# Patient Record
Sex: Female | Born: 1960 | Race: White | Hispanic: No | Smoking: Never smoker
Health system: Southern US, Community
[De-identification: ages and names within clinical notes are randomized; demographics above are authoritative.]

## PROBLEM LIST (undated history)

## (undated) DIAGNOSIS — H9190 Unspecified hearing loss, unspecified ear: Secondary | ICD-10-CM

## (undated) DIAGNOSIS — M549 Dorsalgia, unspecified: Secondary | ICD-10-CM

## (undated) DIAGNOSIS — F1010 Alcohol abuse, uncomplicated: Secondary | ICD-10-CM

## (undated) DIAGNOSIS — M1990 Unspecified osteoarthritis, unspecified site: Secondary | ICD-10-CM

## (undated) HISTORY — PX: TUBAL LIGATION: SHX77

## (undated) HISTORY — PX: FRACTURE SURGERY: SHX138

## (undated) HISTORY — PX: APPENDECTOMY: SHX54

## (undated) HISTORY — PX: BACK SURGERY: SHX140

---
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
## 1991-06-04 HISTORY — PX: APPENDECTOMY: SHX54

## 2007-08-04 ENCOUNTER — Emergency Department (HOSPITAL_COMMUNITY): Admission: EM | Admit: 2007-08-04 | Discharge: 2007-08-04 | Payer: Self-pay | Admitting: Emergency Medicine

## 2009-05-07 ENCOUNTER — Emergency Department (HOSPITAL_COMMUNITY): Admission: EM | Admit: 2009-05-07 | Discharge: 2009-05-07 | Payer: Self-pay | Admitting: Emergency Medicine

## 2009-05-11 ENCOUNTER — Emergency Department (HOSPITAL_COMMUNITY): Admission: EM | Admit: 2009-05-11 | Discharge: 2009-05-11 | Payer: Self-pay | Admitting: Emergency Medicine

## 2009-05-13 ENCOUNTER — Emergency Department (HOSPITAL_COMMUNITY): Admission: EM | Admit: 2009-05-13 | Discharge: 2009-05-13 | Payer: Self-pay | Admitting: Emergency Medicine

## 2009-10-22 ENCOUNTER — Emergency Department (HOSPITAL_COMMUNITY): Admission: EM | Admit: 2009-10-22 | Discharge: 2009-10-22 | Payer: Self-pay | Admitting: Emergency Medicine

## 2010-04-24 ENCOUNTER — Emergency Department (HOSPITAL_COMMUNITY): Admission: EM | Admit: 2010-04-24 | Discharge: 2010-04-24 | Payer: Self-pay | Admitting: Emergency Medicine

## 2010-10-21 ENCOUNTER — Emergency Department (HOSPITAL_COMMUNITY)
Admission: EM | Admit: 2010-10-21 | Discharge: 2010-10-22 | Disposition: A | Discharge status: Home / Self Care | Payer: Self-pay | Attending: Emergency Medicine | Admitting: Emergency Medicine

## 2010-10-21 DIAGNOSIS — N390 Urinary tract infection, site not specified: Secondary | ICD-10-CM | POA: Insufficient documentation

## 2010-10-21 DIAGNOSIS — R109 Unspecified abdominal pain: Secondary | ICD-10-CM | POA: Insufficient documentation

## 2010-10-22 LAB — URINE MICROSCOPIC-ADD ON

## 2010-10-22 LAB — COMPREHENSIVE METABOLIC PANEL
ALT: 40 U/L — ABNORMAL HIGH (ref 0–35)
AST: 32 U/L (ref 0–37)
Albumin: 2.9 g/dL — ABNORMAL LOW (ref 3.5–5.2)
Alkaline Phosphatase: 46 U/L (ref 39–117)
CO2: 27 mEq/L (ref 19–32)
Chloride: 100 mEq/L (ref 96–112)
GFR calc Af Amer: >60 mL/min (ref >60)
GFR calc non Af Amer: >60 mL/min (ref >60)
Potassium: 3.6 mEq/L (ref 3.5–5.1)
Sodium: 133 mEq/L — ABNORMAL LOW (ref 135–145)
Total Bilirubin: 0.2 mg/dL — ABNORMAL LOW (ref 0.3–1.2)

## 2010-10-22 LAB — DIFFERENTIAL
Basophils Relative: 0 % (ref 0–1)
Eosinophils Absolute: 0.1 10*3/uL (ref 0.0–0.7)
Lymphs Abs: 1.2 10*3/uL (ref 0.7–4.0)
Monocytes Relative: 8 % (ref 3–12)
Neutro Abs: 5.2 10*3/uL (ref 1.7–7.7)
Neutrophils Relative %: 74 % (ref 43–77)

## 2010-10-22 LAB — URINALYSIS, ROUTINE W REFLEX MICROSCOPIC
Ketones, ur: NEGATIVE mg/dL
Specific Gravity, Urine: 1.02 (ref 1.005–1.030)
Urobilinogen, UA: 0.2 mg/dL (ref 0.0–1.0)
pH: 6 (ref 5.0–8.0)

## 2010-10-22 LAB — CBC
Hemoglobin: 12.5 g/dL (ref 12.0–15.0)
MCH: 34 pg (ref 26.0–34.0)
Platelets: 179 10*3/uL (ref 150–400)
RBC: 3.68 MIL/uL — ABNORMAL LOW (ref 3.87–5.11)
WBC: 7 10*3/uL (ref 4.0–10.5)

## 2010-10-24 LAB — URINE CULTURE: Colony Count: >=100000

## 2010-11-10 ENCOUNTER — Emergency Department (HOSPITAL_COMMUNITY)
Admission: EM | Admit: 2010-11-10 | Discharge: 2010-11-10 | Disposition: A | Discharge status: Home / Self Care | Payer: Self-pay | Attending: Emergency Medicine | Admitting: Emergency Medicine

## 2010-11-10 DIAGNOSIS — B029 Zoster without complications: Secondary | ICD-10-CM | POA: Insufficient documentation

## 2010-11-15 ENCOUNTER — Emergency Department (HOSPITAL_COMMUNITY)
Admission: EM | Admit: 2010-11-15 | Discharge: 2010-11-15 | Disposition: A | Discharge status: Home / Self Care | Payer: Self-pay | Attending: Emergency Medicine | Admitting: Emergency Medicine

## 2010-11-15 DIAGNOSIS — B029 Zoster without complications: Secondary | ICD-10-CM | POA: Insufficient documentation

## 2010-11-15 DIAGNOSIS — F1021 Alcohol dependence, in remission: Secondary | ICD-10-CM | POA: Insufficient documentation

## 2011-01-24 ENCOUNTER — Emergency Department (HOSPITAL_COMMUNITY)
Admission: EM | Admit: 2011-01-24 | Discharge: 2011-01-24 | Disposition: A | Discharge status: Home / Self Care | Payer: Self-pay | Attending: Emergency Medicine | Admitting: Emergency Medicine

## 2011-01-24 ENCOUNTER — Encounter: Payer: Self-pay | Admitting: Emergency Medicine

## 2011-01-24 DIAGNOSIS — L03119 Cellulitis of unspecified part of limb: Secondary | ICD-10-CM | POA: Insufficient documentation

## 2011-01-24 DIAGNOSIS — L02419 Cutaneous abscess of limb, unspecified: Secondary | ICD-10-CM | POA: Insufficient documentation

## 2011-01-24 DIAGNOSIS — L0291 Cutaneous abscess, unspecified: Secondary | ICD-10-CM

## 2011-01-24 MED ORDER — SULFAMETHOXAZOLE-TRIMETHOPRIM 800-160 MG PO TABS: 1.0000 | ORAL_TABLET | Freq: Two times a day (BID) | ORAL | Status: AC

## 2011-01-24 MED ORDER — HYDROCODONE-ACETAMINOPHEN 5-325 MG PO TABS: ORAL_TABLET | ORAL | Status: DC

## 2011-01-24 NOTE — ED Provider Notes (Signed)
Medical screening examination/treatment/procedure(s) were performed by non-physician practitioner and as supervising physician I was immediately available for consultation/collaboration.   Lyanne Co, MD 01/24/11 [phone]

## 2011-01-24 NOTE — ED Notes (Signed)
Pt c/o "spider bite" to r leg x 4 days.

## 2011-01-24 NOTE — ED Provider Notes (Signed)
History     CSN: [id number] Arrival date & time: 01/24/2011  4:00 PM  Chief Complaint  Patient presents with  . Abscess   Patient is a 50 y.o. female presenting with abscess. The history is provided by the patient.  Abscess  This is a new problem. The current episode started less than one week ago. The problem has been unchanged. The abscess is present on the right upper leg. The problem is moderate. The abscess is characterized by redness and painfulness. It is unknown what she was exposed to. Pertinent negatives include no anorexia, no fever, no vomiting and no cough. She has received no recent medical care.    History reviewed. No pertinent past medical history.  Past Surgical History  Procedure Date  . Tubal ligation   . Back surgery     No family history on file.  History  Substance Use Topics  . Smoking status: Never Smoker   . Smokeless tobacco: Not on file  . Alcohol Use: 3.6 oz/week    6 Cans of beer per week    OB History    Grav Para Term Preterm Abortions TAB SAB Ect Mult Living   3 2  2 1  1   2       Review of Systems  Constitutional: Negative for fever and activity change.       All ROS Neg except as noted in HPI  HENT: Negative for nosebleeds and neck pain.   Eyes: Negative for photophobia and discharge.  Respiratory: Negative for cough, shortness of breath and wheezing.   Cardiovascular: Negative for chest pain and palpitations.  Gastrointestinal: Negative for vomiting, abdominal pain, blood in stool and anorexia.  Genitourinary: Negative for dysuria, frequency and hematuria.  Musculoskeletal: Negative for back pain and arthralgias.  Skin: Negative.   Neurological: Negative for dizziness, seizures and speech difficulty.  Psychiatric/Behavioral: Negative for hallucinations and confusion.    Physical Exam  BP 130/90  Pulse 75  Temp(Src) 98.2 F (36.8 C) (Oral)  Resp 20  Ht 5\' 3"  (1.6 m)  Wt 145 lb (65.772 kg)  BMI 25.69 kg/m2  SpO2 100%  LMP  01/13/2011  Physical Exam  Nursing note and vitals reviewed. Constitutional: She is oriented to person, place, and time. She appears well-developed and well-nourished.  Non-toxic appearance.  HENT:  Head: Normocephalic.  Right Ear: Tympanic membrane and external ear normal.  Left Ear: Tympanic membrane and external ear normal.  Eyes: EOM and lids are normal. Pupils are equal, round, and reactive to light.  Neck: Normal range of motion. Neck supple. Carotid bruit is not present.  Cardiovascular: Normal rate, regular rhythm, normal heart sounds, intact distal pulses and normal pulses.   Pulmonary/Chest: Breath sounds normal. No respiratory distress.  Abdominal: Soft. Bowel sounds are normal. There is no tenderness. There is no guarding.  Musculoskeletal: Normal range of motion.       Abscess of the posterior right thigh. No red streaking. Not hot. No palpable nodes of the inguinal area.  Lymphadenopathy:       Head (right side): No submandibular adenopathy present.       Head (left side): No submandibular adenopathy present.    She has no cervical adenopathy.  Neurological: She is alert and oriented to person, place, and time. She has normal strength. No cranial nerve deficit or sensory deficit.  Skin: Skin is warm and dry.  Psychiatric: She has a normal mood and affect. Her speech is normal.    ED Course  Procedures  MDM I have reviewed nursing notes, vital signs, and all appropriate lab and imaging results for this patient.  Plan Warm tub soaks, bactrim ds, return if not improving.    Kathie Dike, [place] 01/24/11 [phone]

## 2011-03-25 ENCOUNTER — Encounter (HOSPITAL_COMMUNITY): Payer: Self-pay | Admitting: *Deleted

## 2011-03-25 ENCOUNTER — Emergency Department (HOSPITAL_COMMUNITY)
Admission: EM | Admit: 2011-03-25 | Discharge: 2011-03-26 | Disposition: A | Discharge status: Home / Self Care | Payer: Self-pay | Attending: Emergency Medicine | Admitting: Emergency Medicine

## 2011-03-25 DIAGNOSIS — R1013 Epigastric pain: Secondary | ICD-10-CM | POA: Insufficient documentation

## 2011-03-25 DIAGNOSIS — R112 Nausea with vomiting, unspecified: Secondary | ICD-10-CM | POA: Insufficient documentation

## 2011-03-25 MED ORDER — PANTOPRAZOLE SODIUM 40 MG PO TBEC
40.0000 mg | DELAYED_RELEASE_TABLET | Freq: Once | ORAL | Status: AC
  Administered 2011-03-26: 40 mg via ORAL
  Filled 2011-03-25: qty 1

## 2011-03-25 MED ORDER — ONDANSETRON 8 MG PO TBDP
8.0000 mg | ORAL_TABLET | Freq: Once | ORAL | Status: AC
  Administered 2011-03-26: 8 mg via ORAL
  Filled 2011-03-25: qty 1

## 2011-03-25 NOTE — ED Notes (Signed)
Epigastric pain, thinks she may have gallbladder disease

## 2011-03-25 NOTE — ED Provider Notes (Signed)
History     CSN: [id number] Arrival date & time: 03/25/2011 10:30 PM   First MD Initiated Contact with Patient 03/25/11 2316      Chief Complaint  Patient presents with  . Abdominal Pain    (Consider location/radiation/quality/duration/timing/severity/associated sxs/prior treatment) HPI Comments: Patient c/o epigastric pain, nausea and vomiting for two days.  States the pain is worse with eating or drinking fluids.  Also states that when she eats the food feels like "it just sitting in my stomach".  She also admits to daily consumption of alcohol usually 6-10 cans of beer per day.  She denies hematemesis, chest pain, shortness of breath, diaphoresis or melena  Patient is a 50 y.o. female presenting with abdominal pain. The history is provided by the patient.  Abdominal Pain The primary symptoms of the illness include abdominal pain, nausea and vomiting. The primary symptoms of the illness do not include fever, fatigue, shortness of breath, diarrhea, hematemesis, hematochezia, dysuria or vaginal bleeding. The current episode started 2 days ago. The onset of the illness was gradual. The problem has not changed since onset. The abdominal pain began 2 days ago. The pain came on gradually. The abdominal pain has been unchanged since its onset. The abdominal pain is located in the epigastric region. The abdominal pain does not radiate. The abdominal pain is relieved by nothing. Exacerbated by: food or fluids.  The vomiting began 2 days ago. Vomiting occurs 2 to 5 times per day. The emesis contains stomach contents.  The illness is associated with alcohol use and eating. The patient states that she believes she is currently not pregnant. The patient has not had a change in bowel habit. Symptoms associated with the illness do not include chills, anorexia, diaphoresis, heartburn, constipation, urgency, hematuria, frequency or back pain. Significant associated medical issues do not include diabetes,  diverticulitis, HIV or cardiac disease.    History reviewed. No pertinent past medical history.  Past Surgical History  Procedure Date  . Tubal ligation   . Back surgery     History reviewed. No pertinent family history.  History  Substance Use Topics  . Smoking status: Never Smoker   . Smokeless tobacco: Not on file  . Alcohol Use: 3.6 oz/week    6 Cans of beer per week    OB History    Grav Para Term Preterm Abortions TAB SAB Ect Mult Living   3 2  2 1  1   2       Review of Systems  Constitutional: Negative for fever, chills, diaphoresis, appetite change and fatigue.  HENT: Negative for sore throat, trouble swallowing, neck pain and neck stiffness.   Respiratory: Negative for cough, shortness of breath and wheezing.   Cardiovascular: Negative for chest pain and palpitations.  Gastrointestinal: Positive for nausea, vomiting and abdominal pain. Negative for heartburn, diarrhea, constipation, blood in stool, hematochezia, anal bleeding, anorexia and hematemesis.  Genitourinary: Negative for dysuria, urgency, frequency, hematuria, flank pain and vaginal bleeding.  Musculoskeletal: Negative for myalgias, back pain and arthralgias.  Skin: Negative for rash.  Neurological: Negative for dizziness, weakness and numbness.  Hematological: Does not bruise/bleed easily.  All other systems reviewed and are negative.    Allergies  Review of patient's allergies indicates no known allergies.  Home Medications   Current Outpatient Rx  Name Route Sig Dispense Refill  . IBUPROFEN 200 MG PO TABS Oral Take 200 mg by mouth 2 (two) times daily as needed. For pain     . HYDROCODONE-ACETAMINOPHEN  5-325 MG PO TABS  1 or 2 po q4h prn 20 tablet 0    BP 123/79  Pulse 72  Temp(Src) 98.4 F (36.9 C) (Oral)  Resp 16  Ht 5\' 3"  (1.6 m)  Wt 140 lb (63.504 kg)  BMI 24.80 kg/m2  SpO2 100%  LMP 03/04/2011  Physical Exam  Nursing note and vitals reviewed. Constitutional: She is oriented  to person, place, and time. She appears well-developed and well-nourished.  Non-toxic appearance. She does not have a sickly appearance. She does not appear ill. No distress.  HENT:  Head: Normocephalic and atraumatic.  Mouth/Throat: Oropharynx is clear and moist.  Neck: Normal range of motion. Neck supple.  Cardiovascular: Normal rate, regular rhythm and normal heart sounds.   Pulmonary/Chest: Effort normal and breath sounds normal. No respiratory distress. She exhibits no tenderness.  Abdominal: Soft. Bowel sounds are normal. She exhibits no distension and no mass. There is no tenderness. There is no rebound and no guarding.  Musculoskeletal: Normal range of motion. She exhibits no tenderness.  Lymphadenopathy:    She has no cervical adenopathy.  Neurological: She is alert and oriented to person, place, and time. No cranial nerve deficit. She exhibits normal muscle tone. Coordination normal.  Skin: Skin is warm and dry.  Psychiatric: She has a normal mood and affect.    ED Course  Procedures (including critical care time)        MDM    11:55 PM Patient is alert, NAD.  Non-toxic appearing. Vitals stable.  Abd is soft, NT on exam.  No peritoneal signs.   I have discussed a possible differential with the patient to include GERD, pancreatitis and/or gallbladder disease.     1:07 AM Labs are pending. Patient is resting comfortably, denies abdominal pain at this time,  I have discussed patient hx with the EDP and he assumes care of the patient at this time.         Belina Mandile L. Alexsys Eskin, [place] 03/26/11 0110

## 2011-03-25 NOTE — ED Notes (Signed)
Pt reports epigastric discomfort that began on Saturday, pt states "it feels like every time I eat the food just gets stuck there," pt admits to x4 episodes of vomiting on Saturday - no vomiting today. Pt denies diarrhea. Pt w/ round abd, BS present x4 quads. Pt also states she is heavy drinker - 6-12 beers per day - denies any abd bloating. Pt in no acute distress at present - family at bedside x1.

## 2011-03-26 ENCOUNTER — Encounter (HOSPITAL_COMMUNITY): Payer: Self-pay | Admitting: Emergency Medicine

## 2011-03-26 LAB — COMPREHENSIVE METABOLIC PANEL
AST: 43 U/L — ABNORMAL HIGH (ref 0–37)
Albumin: 3.3 g/dL — ABNORMAL LOW (ref 3.5–5.2)
Calcium: 9.4 mg/dL (ref 8.4–10.5)
Chloride: 104 mEq/L (ref 96–112)
Creatinine, Ser: 0.83 mg/dL (ref 0.50–1.10)
Sodium: 139 mEq/L (ref 135–145)

## 2011-03-26 LAB — DIFFERENTIAL
Basophils Absolute: 0 10*3/uL (ref 0.0–0.1)
Basophils Relative: 0 % (ref 0–1)
Eosinophils Relative: 3 % (ref 0–5)
Monocytes Absolute: 0.5 10*3/uL (ref 0.1–1.0)
Neutro Abs: 3.3 10*3/uL (ref 1.7–7.7)

## 2011-03-26 LAB — CBC
HCT: 35.8 % — ABNORMAL LOW (ref 36.0–46.0)
MCHC: 34.6 g/dL (ref 30.0–36.0)
Platelets: 240 10*3/uL (ref 150–400)
RDW: 12.3 % (ref 11.5–15.5)

## 2011-03-26 LAB — URINALYSIS, ROUTINE W REFLEX MICROSCOPIC
Glucose, UA: NEGATIVE mg/dL
Ketones, ur: NEGATIVE mg/dL
Leukocytes, UA: NEGATIVE
Protein, ur: NEGATIVE mg/dL
Urobilinogen, UA: 0.2 mg/dL (ref 0.0–1.0)

## 2011-03-26 LAB — ETHANOL: Alcohol, Ethyl (B): <11 mg/dL (ref 0–11)

## 2011-03-26 MED ORDER — FAMOTIDINE 40 MG PO TABS: 20.0000 mg | ORAL_TABLET | Freq: Every day | ORAL | Status: DC

## 2011-03-26 NOTE — ED Provider Notes (Addendum)
Patient has improved symptoms, lab work shows slight elevation of the liver function tests, these results were explained to the patient and I have encouraged her to seek followup care should she develop severe or worsening symptoms. I suspect given her heavy alcohol use in recent use of ibuprofen for ongoing minor aches and pains that this is related to gastritis. I have encouraged her to followup for severe or worsening symptoms. She is also agreed to reduce her alcohol consumption and NSAID use.  Physical exam no abdominal pain on my exam, no tenderness to palpation, non-peritoneal. Heart and lungs normal, oropharynx clear and moist, mental status normal.  Assessment, likely gastritis, recommend Pepcid, followup with primary care physician, reduction in alcohol intake.  Vida Roller, MD 03/26/11 [phone]  Medical screening examination/treatment/procedure(s) were conducted as a shared visit with non-physician practitioner(s) and myself.  I personally evaluated the patient during the encounter   Vida Roller, MD 03/27/11 2308

## 2011-03-26 NOTE — ED Notes (Signed)
Rx given x1 D/c instructions reviewed w/ pt and family - pt and family deny any further questions or concerns at present.  

## 2011-03-26 NOTE — ED Notes (Signed)
Spoke w/ lab about delay on pt's lab results. States they will check on lab status and return call w/ update.

## 2012-01-13 ENCOUNTER — Encounter (HOSPITAL_COMMUNITY): Payer: Self-pay | Admitting: *Deleted

## 2012-01-13 ENCOUNTER — Emergency Department (HOSPITAL_COMMUNITY)
Admission: EM | Admit: 2012-01-13 | Discharge: 2012-01-13 | Disposition: A | Discharge status: Home / Self Care | Payer: Self-pay | Attending: Emergency Medicine | Admitting: Emergency Medicine

## 2012-01-13 ENCOUNTER — Emergency Department (HOSPITAL_COMMUNITY): Payer: Self-pay

## 2012-01-13 DIAGNOSIS — M545 Low back pain, unspecified: Secondary | ICD-10-CM | POA: Insufficient documentation

## 2012-01-13 DIAGNOSIS — M549 Dorsalgia, unspecified: Secondary | ICD-10-CM

## 2012-01-13 LAB — URINALYSIS, ROUTINE W REFLEX MICROSCOPIC
Leukocytes, UA: NEGATIVE
Nitrite: NEGATIVE
Specific Gravity, Urine: 1.02 (ref 1.005–1.030)
pH: 5.5 (ref 5.0–8.0)

## 2012-01-13 LAB — PREGNANCY, URINE: Preg Test, Ur: NEGATIVE

## 2012-01-13 MED ORDER — HYDROCODONE-ACETAMINOPHEN 5-325 MG PO TABS
1.0000 | ORAL_TABLET | Freq: Once | ORAL | Status: AC
  Administered 2012-01-13: 1 via ORAL

## 2012-01-13 MED ORDER — HYDROCODONE-ACETAMINOPHEN 5-325 MG PO TABS
ORAL_TABLET | ORAL | Status: AC
  Administered 2012-01-13: 1 via ORAL
  Filled 2012-01-13: qty 1

## 2012-01-13 MED ORDER — NAPROXEN 500 MG PO TABS: 500.0000 mg | ORAL_TABLET | Freq: Two times a day (BID) | ORAL | Status: DC

## 2012-01-13 MED ORDER — HYDROCODONE-ACETAMINOPHEN 5-325 MG PO TABS: ORAL_TABLET | ORAL | Status: AC

## 2012-01-13 MED ORDER — CYCLOBENZAPRINE HCL 10 MG PO TABS: 10.0000 mg | ORAL_TABLET | Freq: Three times a day (TID) | ORAL | Status: AC | PRN

## 2012-01-13 NOTE — ED Notes (Signed)
Low back pain for 3 weeks, no known injury.

## 2012-01-13 NOTE — ED Notes (Signed)
Patient with no complaints at this time. Respirations even and unlabored. Skin warm/dry. Discharge instructions reviewed with patient at this time. Patient given opportunity to voice concerns/ask questions. Patient discharged at this time and left Emergency Department with steady gait.   

## 2012-01-18 NOTE — ED Provider Notes (Signed)
Medical screening examination/treatment/procedure(s) were performed by non-physician practitioner and as supervising physician I was immediately available for consultation/collaboration.   Tenea Sens L Caylei Sperry, MD 01/18/12 1503 

## 2012-01-18 NOTE — ED Provider Notes (Signed)
History     CSN: [id number]  Arrival date & time 01/13/12  1332   First MD Initiated Contact with Patient 01/13/12 1355      Chief Complaint  Patient presents with  . Back Pain    (Consider location/radiation/quality/duration/timing/severity/associated sxs/prior treatment) HPI Comments: Patient c/o low back pain for 3 weeks.  States the pain is aching and radiates across her lower back. Pain is worse with certain positions and improves with rest.  She denies incontinence, urinary sx's. abd pain or numbness/weakness of her legs.    Patient is a 51 y.o. female presenting with back pain. The history is provided by the patient.  Back Pain  This is a new problem. The current episode started more than 1 week ago. The problem occurs constantly. The problem has not changed since onset.The pain is associated with no known injury. The pain is present in the lumbar spine and sacro-iliac joint. The quality of the pain is described as aching. The pain does not radiate. The pain is moderate. The symptoms are aggravated by bending, twisting and certain positions. Pertinent negatives include no chest pain, no fever, no numbness, no abdominal pain, no abdominal swelling, no bowel incontinence, no perianal numbness, no bladder incontinence, no dysuria, no pelvic pain, no leg pain, no paresthesias, no paresis, no tingling and no weakness. She has tried NSAIDs and analgesics for the symptoms. The treatment provided no relief.    History reviewed. No pertinent past medical history.  Past Surgical History  Procedure Date  . Tubal ligation   . Back surgery   . Appendectomy     History reviewed. No pertinent family history.  History  Substance Use Topics  . Smoking status: Never Smoker   . Smokeless tobacco: Not on file  . Alcohol Use: 3.6 oz/week    6 Cans of beer per week    OB History    Grav Para Term Preterm Abortions TAB SAB Ect Mult Living   3 2  2 1  1   2       Review of Systems    Constitutional: Negative for fever.  Respiratory: Negative for shortness of breath.   Cardiovascular: Negative for chest pain.  Gastrointestinal: Negative for vomiting, abdominal pain, constipation and bowel incontinence.  Genitourinary: Negative for bladder incontinence, dysuria, hematuria, flank pain, decreased urine volume, difficulty urinating and pelvic pain.       Perineal numbness or incontinence of urine or feces  Musculoskeletal: Positive for back pain. Negative for joint swelling.  Skin: Negative for rash.  Neurological: Negative for tingling, weakness, numbness and paresthesias.  All other systems reviewed and are negative.    Allergies  Review of patient's allergies indicates no known allergies.  Home Medications   Current Outpatient Rx  Name Route Sig Dispense Refill  . CYCLOBENZAPRINE HCL 10 MG PO TABS Oral Take 1 tablet (10 mg total) by mouth 3 (three) times daily as needed for muscle spasms. 21 tablet 0  . FAMOTIDINE 40 MG PO TABS Oral Take 0.5 tablets (20 mg total) by mouth daily. OTC 30 tablet 0  . HYDROCODONE-ACETAMINOPHEN 5-325 MG PO TABS  1 or 2 po q4h prn 20 tablet 0  . HYDROCODONE-ACETAMINOPHEN 5-325 MG PO TABS  Take one-two tabs po q 4-6 hrs prn pain 20 tablet 0  . IBUPROFEN 200 MG PO TABS Oral Take 200 mg by mouth 2 (two) times daily as needed. For pain     . NAPROXEN 500 MG PO TABS Oral Take 1  tablet (500 mg total) by mouth 2 (two) times daily. 30 tablet 0    BP 118/60  Pulse 63  Temp 98.1 F (36.7 C) (Oral)  Resp 20  Ht 5\' 3"  (1.6 m)  Wt 145 lb (65.772 kg)  BMI 25.69 kg/m2  SpO2 100%  LMP 01/07/2012  Physical Exam  Nursing note and vitals reviewed. Constitutional: She is oriented to person, place, and time. She appears well-developed and well-nourished. No distress.  HENT:  Head: Normocephalic and atraumatic.  Neck: Normal range of motion. Neck supple.  Cardiovascular: Normal rate, regular rhythm and intact distal pulses.   No murmur  heard. Pulmonary/Chest: Effort normal and breath sounds normal.  Musculoskeletal: She exhibits tenderness. She exhibits no edema.       Lumbar back: She exhibits tenderness, bony tenderness and pain. She exhibits normal range of motion, no swelling, no deformity, no laceration and normal pulse.       Back:  Neurological: She is alert and oriented to person, place, and time. No cranial nerve deficit or sensory deficit. She exhibits normal muscle tone. Coordination and gait normal.  Reflex Scores:      Patellar reflexes are 2+ on the right side and 2+ on the left side.      Achilles reflexes are 2+ on the right side and 2+ on the left side. Skin: Skin is warm and dry.    ED Course  Procedures (including critical care time)  Dg Lumbar Spine Complete  01/13/2012  *RADIOLOGY REPORT*  Clinical Data: Back pain  LUMBAR SPINE - COMPLETE 4+ VIEW  Comparison: None.  Findings: Severe narrowing at L4-5 and L5-S1.  No vertebral compression deformity.  No definite pars defect.  No acute fracture. Slight dextroscoliosis at L2-3.  IMPRESSION: No acute bony pathology.  Degenerative changes.  Original Report Authenticated By: Donavan Burnet, M.D.    Results for orders placed during the hospital encounter of 01/13/12  URINALYSIS, ROUTINE W REFLEX MICROSCOPIC      Component Value Range   Color, Urine YELLOW  YELLOW   APPearance CLEAR  CLEAR   Specific Gravity, Urine 1.020  1.005 - 1.030   pH 5.5  5.0 - 8.0   Glucose, UA NEGATIVE  NEGATIVE mg/dL   Hgb urine dipstick NEGATIVE  NEGATIVE   Bilirubin Urine NEGATIVE  NEGATIVE   Ketones, ur NEGATIVE  NEGATIVE mg/dL   Protein, ur NEGATIVE  NEGATIVE mg/dL   Urobilinogen, UA 0.2  0.0 - 1.0 mg/dL   Nitrite NEGATIVE  NEGATIVE   Leukocytes, UA NEGATIVE  NEGATIVE  PREGNANCY, URINE      Component Value Range   Preg Test, Ur NEGATIVE  NEGATIVE     1. Back pain       MDM    Patient has ttp of the lumbar paraspinal muscles.  No focal neuro deficits on exam.   Ambulates with a steady gait.  Doubt infectious or acute neurological process at this time.  Pt agrees to close f/u with her PMD for recheck or to return here if the sx's worsen   Prescribed Naprosyn norco #[id number]   Thornton Dohrmann L. [place], [place] 01/18/12 1212

## 2012-07-14 ENCOUNTER — Other Ambulatory Visit (HOSPITAL_COMMUNITY): Payer: Self-pay | Admitting: *Deleted

## 2012-07-14 DIAGNOSIS — Z139 Encounter for screening, unspecified: Secondary | ICD-10-CM

## 2012-07-20 ENCOUNTER — Ambulatory Visit (HOSPITAL_COMMUNITY): Payer: Self-pay

## 2012-07-21 ENCOUNTER — Ambulatory Visit (HOSPITAL_COMMUNITY)
Admission: RE | Admit: 2012-07-21 | Discharge: 2012-07-21 | Disposition: A | Discharge status: Home / Self Care | Payer: Self-pay | Source: Ambulatory Visit | Attending: *Deleted | Admitting: *Deleted

## 2012-07-21 DIAGNOSIS — Z139 Encounter for screening, unspecified: Secondary | ICD-10-CM

## 2013-01-01 ENCOUNTER — Emergency Department (HOSPITAL_COMMUNITY)
Admission: EM | Admit: 2013-01-01 | Discharge: 2013-01-01 | Disposition: A | Discharge status: Home / Self Care | Payer: Self-pay | Attending: Emergency Medicine | Admitting: Emergency Medicine

## 2013-01-01 ENCOUNTER — Encounter (HOSPITAL_COMMUNITY): Payer: Self-pay | Admitting: *Deleted

## 2013-01-01 DIAGNOSIS — S39012A Strain of muscle, fascia and tendon of lower back, initial encounter: Secondary | ICD-10-CM

## 2013-01-01 DIAGNOSIS — X58XXXA Exposure to other specified factors, initial encounter: Secondary | ICD-10-CM | POA: Insufficient documentation

## 2013-01-01 DIAGNOSIS — Y939 Activity, unspecified: Secondary | ICD-10-CM | POA: Insufficient documentation

## 2013-01-01 DIAGNOSIS — S335XXA Sprain of ligaments of lumbar spine, initial encounter: Secondary | ICD-10-CM | POA: Insufficient documentation

## 2013-01-01 DIAGNOSIS — Y929 Unspecified place or not applicable: Secondary | ICD-10-CM | POA: Insufficient documentation

## 2013-01-01 MED ORDER — HYDROCODONE-ACETAMINOPHEN 5-325 MG PO TABS: 1.0000 | ORAL_TABLET | ORAL | Status: DC | PRN

## 2013-01-01 MED ORDER — CYCLOBENZAPRINE HCL 10 MG PO TABS: 10.0000 mg | ORAL_TABLET | Freq: Two times a day (BID) | ORAL | Status: DC | PRN

## 2013-01-01 NOTE — ED Provider Notes (Signed)
CSN: [id number]     Arrival date & time 01/01/13  1256 History     First MD Initiated Contact with Patient 01/01/13 1329     Chief Complaint  Patient presents with  . Back Pain   (Consider location/radiation/quality/duration/timing/severity/associated sxs/prior Treatment) Patient is a 52 y.o. female presenting with back pain. The history is provided by the patient.  Back Pain Location:  Lumbar spine Quality:  Aching Radiates to:  Does not radiate Pain severity:  Severe (9/10) Pain is:  Same all the time Onset quality:  Gradual Duration:  2 months Timing:  Constant Progression:  Worsening Chronicity:  New Relieved by:  Nothing Worsened by:  Deep breathing, movement, standing and twisting Ineffective treatments:  Ibuprofen and being still Associated symptoms: no abdominal pain, no bladder incontinence, no bowel incontinence, no dysuria, no fever, no numbness and no weakness    Allison Murray is a 52 y.o. female who presents to the ED with back pain that has been ongoing x 2 months. She states that over the past couple days the pain has been constant and worse. She does not remember any injury to the area. She had similar pain about a year ago and had x-ray that showed narrowing L4-5 and L5-S1.  History reviewed. No pertinent past medical history. Past Surgical History  Procedure Laterality Date  . Tubal ligation    . Back surgery    . Appendectomy     History reviewed. No pertinent family history. History  Substance Use Topics  . Smoking status: Never Smoker   . Smokeless tobacco: Not on file  . Alcohol Use: 3.6 oz/week    6 Cans of beer per week     Comment: daily   OB History   Grav Para Term Preterm Abortions TAB SAB Ect Mult Living   3 2  2 1  1   2      Review of Systems  Constitutional: Negative for fever.  Gastrointestinal: Negative for abdominal pain and bowel incontinence.  Genitourinary: Negative for bladder incontinence and dysuria.  Musculoskeletal:  Positive for back pain.  Neurological: Negative for weakness and numbness.    Allergies  Review of patient's allergies indicates no known allergies.  Home Medications   Current Outpatient Rx  Name  Route  Sig  Dispense  Refill  . acetaminophen (TYLENOL) 500 MG tablet   Oral   Take 1,000 mg by mouth every 4 (four) hours as needed for pain.          BP 125/76  Pulse 63  Temp(Src) 97.8 F (36.6 C) (Oral)  Resp 16  Ht 5\' 3"  (1.6 m)  Wt 145 lb (65.772 kg)  BMI 25.69 kg/m2  SpO2 98%  LMP 01/07/2012 Physical Exam  Nursing note and vitals reviewed. Constitutional: She is oriented to person, place, and time. She appears well-developed and well-nourished. No distress.  HENT:  Head: Normocephalic.  Eyes: EOM are normal.  Neck: Neck supple.  Cardiovascular: Normal rate and regular rhythm.   Pulmonary/Chest: Effort normal and breath sounds normal.  Abdominal: Soft. Bowel sounds are normal. There is no tenderness.  Musculoskeletal:       Lumbar back: She exhibits decreased range of motion, tenderness and spasm.       Back:  Neurological: She is alert and oriented to person, place, and time. She has normal strength and normal reflexes. No cranial nerve deficit or sensory deficit.  Pedal pulses equal, adequate circulation.  Skin: Skin is warm and dry.  Psychiatric:  She has a normal mood and affect. Her behavior is normal.    ED Course   Procedures   MDM  52 y.o. female with muscle spasm and pain left lumbar area. Will treat with muscle relaxants and pain medication. Patient will continue ibuprofen and follow up with Dr. Romeo Apple is symptoms persist. She will return here as needed. Discussed with the patient and all questioned fully answered.   Medication List    TAKE these medications       cyclobenzaprine 10 MG tablet  Commonly known as:  FLEXERIL  Take 1 tablet (10 mg total) by mouth 2 (two) times daily as needed for muscle spasms.     HYDROcodone-acetaminophen 5-325  MG per tablet  Commonly known as:  NORCO/VICODIN  Take 1 tablet by mouth every 4 (four) hours as needed.      ASK your doctor about these medications       acetaminophen 500 MG tablet  Commonly known as:  TYLENOL  Take 1,000 mg by mouth every 4 (four) hours as needed for pain.         [facility] Orlene Och, NP 01/01/13 1351

## 2013-01-01 NOTE — ED Notes (Signed)
Pain in lumbar spine x 2 months.  Has been using OTCs , but over last few weeks pain has gotten worse and is not controlled by prior measures. No known mechanism of injury.

## 2013-01-01 NOTE — ED Provider Notes (Signed)
Medical screening examination/treatment/procedure(s) were performed by non-physician practitioner and as supervising physician I was immediately available for consultation/collaboration.   Abdulmalik Darco, MD 01/01/13 1448 

## 2013-01-01 NOTE — ED Notes (Addendum)
States that she has been having lower back pain for the past several months, but over the last several days, the pain is worse.  She states that the pain is sharp in nature.  States that she does feel weak at times and feels like her legs are going to "give out."  Denies ever being evaluated for back pain in her life.

## 2013-01-27 ENCOUNTER — Encounter (HOSPITAL_COMMUNITY): Payer: Self-pay

## 2013-01-27 ENCOUNTER — Emergency Department (HOSPITAL_COMMUNITY)
Admission: EM | Admit: 2013-01-27 | Discharge: 2013-01-27 | Disposition: A | Discharge status: Home / Self Care | Payer: Self-pay | Attending: Emergency Medicine | Admitting: Emergency Medicine

## 2013-01-27 DIAGNOSIS — L239 Allergic contact dermatitis, unspecified cause: Secondary | ICD-10-CM

## 2013-01-27 DIAGNOSIS — L255 Unspecified contact dermatitis due to plants, except food: Secondary | ICD-10-CM | POA: Insufficient documentation

## 2013-01-27 DIAGNOSIS — L299 Pruritus, unspecified: Secondary | ICD-10-CM | POA: Insufficient documentation

## 2013-01-27 MED ORDER — LORATADINE 10 MG PO TABS
10.0000 mg | ORAL_TABLET | Freq: Once | ORAL | Status: AC
  Administered 2013-01-27: 10 mg via ORAL
  Filled 2013-01-27: qty 1

## 2013-01-27 MED ORDER — FAMOTIDINE 20 MG PO TABS
20.0000 mg | ORAL_TABLET | Freq: Once | ORAL | Status: AC
  Administered 2013-01-27: 20 mg via ORAL
  Filled 2013-01-27: qty 1

## 2013-01-27 MED ORDER — PREDNISONE 10 MG PO TABS: 20.0000 mg | ORAL_TABLET | Freq: Two times a day (BID) | ORAL | Status: DC

## 2013-01-27 MED ORDER — FAMOTIDINE 20 MG PO TABS: 20.0000 mg | ORAL_TABLET | Freq: Two times a day (BID) | ORAL | Status: DC

## 2013-01-27 MED ORDER — PREDNISONE 20 MG PO TABS
40.0000 mg | ORAL_TABLET | Freq: Once | ORAL | Status: AC
  Administered 2013-01-27: 40 mg via ORAL
  Filled 2013-01-27: qty 2

## 2013-01-27 NOTE — ED Notes (Signed)
[place] and started itching all over MOnday night and noticed red raised rash Tuesday. Reports rash is on legs, groin, back, and abd.

## 2013-01-27 NOTE — ED Provider Notes (Signed)
CSN: [id number]     Arrival date & time 01/27/13  1243 History   First MD Initiated Contact with Patient 01/27/13 1357     Chief Complaint  Patient presents with  . Rash   (Consider location/radiation/quality/duration/timing/severity/associated sxs/prior Treatment) HPI Allison Murray is a 52 y.o. female who presents to the ED with a rash. The rash started 3 days ago. She had been out walking in the woods the day before and picked up a big mushroom. The rash is located on the legs, shoulders and lower back. She has had itching. She denies fever, chills, nausea, vomiting, fever or other problems. No new foods, lotions or detergents. She did change and start using bounce in the dryer. She denies shortness of breath. The history was provided by the patient.   History reviewed. No pertinent past medical history. Past Surgical History  Procedure Laterality Date  . Tubal ligation    . Back surgery    . Appendectomy     No family history on file. History  Substance Use Topics  . Smoking status: Never Smoker   . Smokeless tobacco: Not on file  . Alcohol Use: 3.6 oz/week    6 Cans of beer per week     Comment: daily   OB History   Grav Para Term Preterm Abortions TAB SAB Ect Mult Living   3 2  2 1  1   2      Review of Systems  Constitutional: Negative for fever and chills.  HENT: Negative for sore throat, trouble swallowing and neck pain.   Respiratory: Negative for shortness of breath.   Gastrointestinal: Negative for nausea, vomiting and abdominal pain.  Musculoskeletal: Negative for joint swelling. Back pain: chronic.  Skin: Positive for rash.  Neurological: Negative for dizziness and headaches.  Psychiatric/Behavioral: The patient is not nervous/anxious.     Allergies  Review of patient's allergies indicates no known allergies.  Home Medications   Current Outpatient Rx  Name  Route  Sig  Dispense  Refill  . diphenhydrAMINE (BENADRYL) 25 mg capsule   Oral   Take 25 mg by  mouth every 6 (six) hours as needed for itching or allergies.          LMP 01/07/2012 Physical Exam  Nursing note and vitals reviewed. Constitutional: She is oriented to person, place, and time. She appears well-developed and well-nourished. No distress.  HENT:  Head: Normocephalic and atraumatic.  Eyes: EOM are normal.  Neck: Normal range of motion. Neck supple.  Cardiovascular: Normal rate and regular rhythm.   Pulmonary/Chest: Effort normal. No respiratory distress. She has no wheezes.  Abdominal: Soft. There is no tenderness.  Musculoskeletal: Normal range of motion. She exhibits no edema.  Neurological: She is alert and oriented to person, place, and time. No cranial nerve deficit.  Skin:  Red, raised rash noted upper legs, shoulders and lower back. No signs of infection.  Psychiatric: She has a normal mood and affect. Her behavior is normal.    ED Course  Procedures   MDM  52 y.o. female with rash and itching, likely contact dermitis. Will treat with steroids, benadryl and Pepcid. Patient to follow up with dermatology if symptoms worsen.  Discussed with the patient and all questioned fully answered.   Medication List    TAKE these medications       famotidine 20 MG tablet  Commonly known as:  PEPCID  Take 1 tablet (20 mg total) by mouth 2 (two) times daily.  predniSONE 10 MG tablet  Commonly known as:  DELTASONE  Take 2 tablets (20 mg total) by mouth 2 (two) times daily.      ASK your doctor about these medications       diphenhydrAMINE 25 mg capsule  Commonly known as:  BENADRYL  Take 25 mg by mouth every 6 (six) hours as needed for itching or allergies.          [facility] Orlene Och, [place] 01/27/13 1438

## 2013-01-27 NOTE — ED Notes (Signed)
Pt has itching rash to legs and truck,  Took benadryl today. Without relief.  Alert,  Says she walked " [place] the creek" and  Picked up a mushroom and wonders if that is causing rash.

## 2013-01-29 NOTE — ED Provider Notes (Signed)
Medical screening examination/treatment/procedure(s) were performed by non-physician practitioner and as supervising physician I was immediately available for consultation/collaboration.  Tyshae Stair, MD 01/29/13 1623 

## 2014-03-16 ENCOUNTER — Emergency Department (HOSPITAL_COMMUNITY)
Admission: EM | Admit: 2014-03-16 | Discharge: 2014-03-16 | Disposition: A | Discharge status: Home / Self Care | Payer: Self-pay | Attending: Emergency Medicine | Admitting: Emergency Medicine

## 2014-03-16 ENCOUNTER — Encounter (HOSPITAL_COMMUNITY): Payer: Self-pay | Admitting: Emergency Medicine

## 2014-03-16 DIAGNOSIS — M545 Low back pain, unspecified: Secondary | ICD-10-CM

## 2014-03-16 DIAGNOSIS — Z79899 Other long term (current) drug therapy: Secondary | ICD-10-CM | POA: Insufficient documentation

## 2014-03-16 DIAGNOSIS — H6091 Unspecified otitis externa, right ear: Secondary | ICD-10-CM | POA: Insufficient documentation

## 2014-03-16 DIAGNOSIS — Z9889 Other specified postprocedural states: Secondary | ICD-10-CM | POA: Insufficient documentation

## 2014-03-16 DIAGNOSIS — G8929 Other chronic pain: Secondary | ICD-10-CM | POA: Insufficient documentation

## 2014-03-16 HISTORY — DX: Dorsalgia, unspecified: M54.9

## 2014-03-16 LAB — URINALYSIS, ROUTINE W REFLEX MICROSCOPIC
BILIRUBIN URINE: NEGATIVE
Glucose, UA: NEGATIVE mg/dL
Hgb urine dipstick: NEGATIVE
Leukocytes, UA: NEGATIVE
NITRITE: NEGATIVE
PROTEIN: NEGATIVE mg/dL
Specific Gravity, Urine: 1.01 (ref 1.005–1.030)
UROBILINOGEN UA: 1 mg/dL (ref 0.0–1.0)
pH: 7 (ref 5.0–8.0)

## 2014-03-16 MED ORDER — HYDROCODONE-ACETAMINOPHEN 5-325 MG PO TABS: 1.0000 | ORAL_TABLET | Freq: Once | ORAL | Status: DC

## 2014-03-16 MED ORDER — IBUPROFEN 600 MG PO TABS: 600.0000 mg | ORAL_TABLET | Freq: Four times a day (QID) | ORAL | Status: DC | PRN

## 2014-03-16 MED ORDER — CIPROFLOXACIN-DEXAMETHASONE 0.3-0.1 % OT SUSP
3.0000 [drp] | Freq: Once | OTIC | Status: AC
  Administered 2014-03-16: 3 [drp] via OTIC
  Filled 2014-03-16: qty 7.5

## 2014-03-16 MED ORDER — IBUPROFEN 800 MG PO TABS
800.0000 mg | ORAL_TABLET | Freq: Once | ORAL | Status: AC
  Administered 2014-03-16: 800 mg via ORAL
  Filled 2014-03-16: qty 1

## 2014-03-16 MED ORDER — CYCLOBENZAPRINE HCL 10 MG PO TABS
10.0000 mg | ORAL_TABLET | Freq: Once | ORAL | Status: AC
  Administered 2014-03-16: 10 mg via ORAL
  Filled 2014-03-16: qty 1

## 2014-03-16 MED ORDER — CYCLOBENZAPRINE HCL 5 MG PO TABS: 5.0000 mg | ORAL_TABLET | Freq: Three times a day (TID) | ORAL | Status: DC | PRN

## 2014-03-16 NOTE — Discharge Instructions (Signed)
Back Pain, Adult °Low back pain is very common. About 1 in 5 people have back pain. The cause of low back pain is rarely dangerous. The pain often gets better over time. About half of people with a sudden onset of back pain feel better in just 2 weeks. About 8 in 10 people feel better by 6 weeks.  °CAUSES °Some common causes of back pain include: °· Strain of the muscles or ligaments supporting the spine. °· Wear and tear (degeneration) of the spinal discs. °· Arthritis. °· Direct injury to the back. °DIAGNOSIS °Most of the time, the direct cause of low back pain is not known. However, back pain can be treated effectively even when the exact cause of the pain is unknown. Answering your caregiver's questions about your overall health and symptoms is one of the most accurate ways to make sure the cause of your pain is not dangerous. If your caregiver needs more information, he or she may order lab work or imaging tests (X-rays or MRIs). However, even if imaging tests show changes in your back, this usually does not require surgery. °HOME CARE INSTRUCTIONS °For many people, back pain returns. Since low back pain is rarely dangerous, it is often a condition that people can learn to manage on their own.  °· Remain active. It is stressful on the back to sit or stand in one place. Do not sit, drive, or stand in one place for more than 30 minutes at a time. Take short walks on level surfaces as soon as pain allows. Try to increase the length of time you walk each day. °· Do not stay in bed. Resting more than 1 or 2 days can delay your recovery. °· Do not avoid exercise or work. Your body is made to move. It is not dangerous to be active, even though your back may hurt. Your back will likely heal faster if you return to being active before your pain is gone. °· Pay attention to your body when you  bend and lift. Many people have less discomfort when lifting if they bend their knees, keep the load close to their bodies, and  avoid twisting. Often, the most comfortable positions are those that put less stress on your recovering back. °· Find a comfortable position to sleep. Use a firm mattress and lie on your side with your knees slightly bent. If you lie on your back, put a pillow under your knees. °· Only take over-the-counter or prescription medicines as directed by your caregiver. Over-the-counter medicines to reduce pain and inflammation are often the most helpful. Your caregiver may prescribe muscle relaxant drugs. These medicines help dull your pain so you can more quickly return to your normal activities and healthy exercise. °· Put ice on the injured area. °¨ Put ice in a plastic bag. °¨ Place a towel between your skin and the bag. °¨ Leave the ice on for 15-20 minutes, 03-04 times a day for the first 2 to 3 days. After that, ice and heat may be alternated to reduce pain and spasms. °· Ask your caregiver about trying back exercises and gentle massage. This may be of some benefit. °· Avoid feeling anxious or stressed. Stress increases muscle tension and can worsen back pain. It is important to recognize when you are anxious or stressed and learn ways to manage it. Exercise is a great option. °SEEK MEDICAL CARE IF: °· You have pain that is not relieved with rest or medicine. °· You have pain that does not improve in 1 week. °· You have new symptoms. °· You are generally not feeling well. °SEEK   IMMEDIATE MEDICAL CARE IF:   You have pain that radiates from your back into your legs.  You develop new bowel or bladder control problems.  You have unusual weakness or numbness in your arms or legs.  You develop nausea or vomiting.  You develop abdominal pain.  You feel faint. Document Released: 05/20/2005 Document Revised: 11/19/2011 Document Reviewed: 09/21/2013 [facility] Patient Information 2015 [place]. This information is not intended to replace advice given to you by your health care provider. Make sure you  discuss any questions you have with your health care provider.    Take your next dose of prednisone tomorrow morning.  Use the the other medicines as directed.   Avoid lifting,  Bending,  Twisting or any other activity that worsens your pain over the next week.  Apply an  icepack  to your lower back for 10-15 minutes every 2 hours for the next 2 days.  You should get rechecked if your symptoms are not better over the next 5 days,  Or you develop increased pain,  Weakness in your leg(s) or loss of bladder or bowel function - these are symptoms of a worse problem.

## 2014-03-16 NOTE — ED Notes (Signed)
Pt reports chronic lower back pain.  Reports recently gotten more severe.  Denies recent injury.  Denies urinary symptoms.

## 2014-03-16 NOTE — ED Notes (Signed)
nad noted prior to dc. Dc instructions reviewed and explained. Voiced understanding. 2 rx given . Ambulated out without difficulty.

## 2014-03-16 NOTE — ED Notes (Signed)
Pt waiting, wanting to eat, advised not to eat, rooms will be ready soon

## 2014-03-18 NOTE — ED Provider Notes (Signed)
Medical screening examination/treatment/procedure(s) were performed by non-physician practitioner and as supervising physician I was immediately available for consultation/collaboration.   EKG Interpretation None        Bennie Scaff, DO 03/18/14 0712 

## 2014-03-18 NOTE — ED Provider Notes (Signed)
CSN: [id number]     Arrival date & time 03/16/14  1252 History   First MD Initiated Contact with Patient 03/16/14 1450     Chief Complaint  Patient presents with  . Back Pain     (Consider location/radiation/quality/duration/timing/severity/associated sxs/prior Treatment) The history is provided by the patient.   Allison Murray is a 53 y.o. female presenting with 2 complaints, the first being acute on chronic low back pain which has been worse over the past week despite any obvious new injury or activity triggering symptoms.  Pain is stabbing with movement and twisting her torso. There is no radiation of pain into her legs.  Also denies weakness or numbness in the lower extremities and no urinary or bowel retention or incontinence.  Patient does not have a history of cancer or IVDU.  She has been seen here occasionally for low back pain concerns with an xray in 2013 showing severe narrowing at L4-5 and L5-S1.  She does not currently have medical coverage or a pcp. She has tried tylenol without relief of pain.  Secondly, states she has chronic intermittent episodes of ear infections and has developed white discharge and mild pain in the right ear this week. She denies fevers, chills, ear trauma, loss of hearing acuity.  She has had no medications for this problem.      Past Medical History  Diagnosis Date  . Back pain    Past Surgical History  Procedure Laterality Date  . Tubal ligation    . Back surgery    . Appendectomy     No family history on file. History  Substance Use Topics  . Smoking status: Never Smoker   . Smokeless tobacco: Not on file  . Alcohol Use: 3.6 oz/week    6 Cans of beer per week     Comment: daily 6 beers   OB History   Grav Para Term Preterm Abortions TAB SAB Ect Mult Living   3 2  2 1  1   2      Review of Systems    Allergies  Review of patient's allergies indicates no known allergies.  Home Medications   Prior to Admission medications     Medication Sig Start Date End Date Taking? Authorizing Provider  cyclobenzaprine (FLEXERIL) 5 MG tablet Take 1 tablet (5 mg total) by mouth 3 (three) times daily as needed. 03/16/14   Burgess AmorJulie Apolonia Ellwood, PA-C  ibuprofen (ADVIL,MOTRIN) 600 MG tablet Take 1 tablet (600 mg total) by mouth every 6 (six) hours as needed. 03/16/14   Burgess AmorJulie Kalid Ghan, PA-C   BP 119/75  Pulse 73  Temp(Src) 98.2 F (36.8 C) (Oral)  Resp 18  Ht 5' 3.5" (1.613 m)  Wt 145 lb (65.772 kg)  BMI 25.28 kg/m2  SpO2 98%  LMP 01/07/2012 Physical Exam  Nursing note and vitals reviewed. Constitutional: She appears well-developed and well-nourished.  HENT:  Head: Normocephalic.  Right Ear: Tympanic membrane normal. There is drainage. No swelling. No mastoid tenderness. Tympanic membrane is not injected, not perforated and not bulging.  White, watery purulence in right ear canal.  Tm normal, canal without significant edema. Increased pain with ear traction.  Eyes: Conjunctivae are normal.  Neck: Normal range of motion. Neck supple.  Cardiovascular: Normal rate and intact distal pulses.   Pedal pulses normal.  Pulmonary/Chest: Effort normal.  Abdominal: Soft. Bowel sounds are normal. She exhibits no distension and no mass.  Musculoskeletal: Normal range of motion. She exhibits no edema.  Lumbar back: She exhibits tenderness and bony tenderness. She exhibits no swelling, no edema and no spasm.       Back:  Neurological: She is alert. She has normal strength. She displays no atrophy and no tremor. No sensory deficit. Gait normal.  Reflex Scores:      Patellar reflexes are 2+ on the right side and 2+ on the left side.      Achilles reflexes are 2+ on the right side and 2+ on the left side. No strength deficit noted in hip and knee flexor and extensor muscle groups.  Ankle flexion and extension intact.  Skin: Skin is warm and dry.  Psychiatric: She has a normal mood and affect.    ED Course  Procedures (including critical care  time) Labs Review Labs Reviewed  URINALYSIS, ROUTINE W REFLEX MICROSCOPIC - Abnormal; Notable for the following:    Ketones, ur TRACE (*)    All other components within normal limits    Imaging Review No results found.   EKG Interpretation None      MDM   Final diagnoses:  Bilateral low back pain without sciatica  Otitis externa, right    No neuro deficit on exam or by history to suggest emergent or surgical presentation.  Also discussed worsened sx that should prompt immediate re-evaluation including distal weakness, bowel/bladder retention/incontinence.  Pt prescribed ibuprofen and flexeril, encouraged heat tx, activity as tolerated.  ciprodex otic drops.  Referrals given for establishing pcp.          Burgess AmorJulie Brandom Kerwin, PA-C 03/18/14 [phone]

## 2014-04-04 ENCOUNTER — Encounter (HOSPITAL_COMMUNITY): Payer: Self-pay | Admitting: Emergency Medicine

## 2014-11-12 ENCOUNTER — Emergency Department (HOSPITAL_COMMUNITY)
Admission: EM | Admit: 2014-11-12 | Discharge: 2014-11-12 | Disposition: A | Discharge status: Home / Self Care | Payer: Self-pay | Attending: Emergency Medicine | Admitting: Emergency Medicine

## 2014-11-12 ENCOUNTER — Encounter (HOSPITAL_COMMUNITY): Payer: Self-pay | Admitting: Emergency Medicine

## 2014-11-12 DIAGNOSIS — K047 Periapical abscess without sinus: Secondary | ICD-10-CM | POA: Insufficient documentation

## 2014-11-12 DIAGNOSIS — B001 Herpesviral vesicular dermatitis: Secondary | ICD-10-CM | POA: Insufficient documentation

## 2014-11-12 DIAGNOSIS — Z8739 Personal history of other diseases of the musculoskeletal system and connective tissue: Secondary | ICD-10-CM | POA: Insufficient documentation

## 2014-11-12 MED ORDER — ACYCLOVIR 200 MG PO CAPS: 400.0000 mg | ORAL_CAPSULE | Freq: Every day | ORAL | Status: DC

## 2014-11-12 MED ORDER — AMOXICILLIN 500 MG PO CAPS: 500.0000 mg | ORAL_CAPSULE | Freq: Three times a day (TID) | ORAL | Status: AC

## 2014-11-12 NOTE — ED Notes (Signed)
Having pain to right upper tooth and have mouth sores.  Rates pain 8/10.  Took Advil at 0700 with mild relief.

## 2014-11-12 NOTE — ED Provider Notes (Signed)
CSN: [id number]     Arrival date & time 11/12/14  1035 History   First MD Initiated Contact with Patient 11/12/14 1056     Chief Complaint  Patient presents with  . Dental Pain  . Mouth Lesions     (Consider location/radiation/quality/duration/timing/severity/associated sxs/prior Treatment) The history is provided by the patient.   Allison Murray is a 54 y.o. female with no significant past medical history presenting with dental pain and lip ulcerations. She was just returned from the beach during which time she had significant sun exposure and endorses prior episodes of "cold sores" but not usually this many sores at once.  She denies throat pain or any skin rashes. The cold sores started 4 days ago. She additionally has developed pain in her right upper molar teeth area over the past week, cold sensitivity, pain with chewing on that side.  She woke today with swelling in her cheek and is concerned for spreading infection. She does not have a local dentist. She has taken ibuprofen and using ambesol with some improvement in pain. She denies difficulty swallowing, denies fevers, nausea or vomiting.    Past Medical History  Diagnosis Date  . Back pain    Past Surgical History  Procedure Laterality Date  . Tubal ligation    . Back surgery    . Appendectomy     History reviewed. No pertinent family history. History  Substance Use Topics  . Smoking status: Never Smoker   . Smokeless tobacco: Not on file  . Alcohol Use: 3.6 oz/week    6 Cans of beer per week     Comment: daily 6 beers   OB History    Gravida Para Term Preterm AB TAB SAB Ectopic Multiple Living   3 2  2 1  1   2      Review of Systems  Constitutional: Negative for fever.  HENT: Positive for dental problem. Negative for facial swelling and sore throat.   Respiratory: Negative for shortness of breath.   Musculoskeletal: Negative for neck pain and neck stiffness.  Skin: Positive for wound.      Allergies   Review of patient's allergies indicates no known allergies.  Home Medications   Prior to Admission medications   Medication Sig Start Date End Date Taking? Authorizing Provider  ibuprofen (ADVIL,MOTRIN) 200 MG tablet Take 200 mg by mouth every 6 (six) hours as needed for moderate pain.   Yes Historical Provider, MD  acyclovir (ZOVIRAX) 200 MG capsule Take 2 capsules (400 mg total) by mouth 5 (five) times daily. 11/12/14   Burgess Amor, PA-C  amoxicillin (AMOXIL) 500 MG capsule Take 1 capsule (500 mg total) by mouth 3 (three) times daily. 11/12/14 11/22/14  Burgess Amor, PA-C   BP 129/77 mmHg  Pulse 67  Temp(Src) 98.6 F (37 C) (Oral)  Resp 16  Ht 5\' 3"  (1.6 m)  Wt 146 lb (66.225 kg)  BMI 25.87 kg/m2  SpO2 99%  LMP 01/07/2012 Physical Exam  Constitutional: She is oriented to person, place, and time. She appears well-developed and well-nourished. No distress.  HENT:  Head: Normocephalic and atraumatic.  Right Ear: Tympanic membrane and external ear normal.  Left Ear: Tympanic membrane and external ear normal.  Mouth/Throat: Oropharynx is clear and moist and mucous membranes are normal. No oral lesions. No trismus in the jaw. Abnormal dentition. Dental abscesses present.  Several areas of decay in right upper 1st and 2nd molar teeth. Lateral gingival induration and induration of buccal mucosa  extending to inferior mid zygoma. No cheek erythema, no fluctuance.  Four small scabbed ulcerations scattered on upper and lower lips c/w herpes flare.  Eyes: Conjunctivae are normal.  Neck: Normal range of motion. Neck supple.  Cardiovascular: Normal rate and normal heart sounds.   Pulmonary/Chest: Effort normal.  Abdominal: She exhibits no distension.  Musculoskeletal: Normal range of motion.  Lymphadenopathy:    She has no cervical adenopathy.  Neurological: She is alert and oriented to person, place, and time.  Skin: Skin is warm and dry. No erythema.  Psychiatric: She has a normal mood and  affect.    ED Course  Procedures (including critical care time) Labs Review Labs Reviewed - No data to display  Imaging Review No results found.   EKG Interpretation None      MDM   Final diagnoses:  Dental abscess  Herpes labialis    Pt placed on amoxil, acyclovir.  Discussed that acyclovir would work better if started within 48 hours of onset of outbreak, pt understands and would still like to try. Advised f/u for any worsened or persistent sx, dental referrals given.  The patient appears reasonably screened and/or stabilized for discharge and I doubt any other medical condition or other [facility] requiring further screening, evaluation, or treatment in the ED at this time prior to discharge.     Burgess Amor, PA-C 11/12/14 2242  Donnetta Hutching, MD 11/13/14 [phone]

## 2014-11-12 NOTE — Discharge Instructions (Signed)
Dental Abscess A dental abscess is a collection of infected fluid (pus) from a bacterial infection in the inner part of the tooth (pulp). It usually occurs at the end of the tooth's root.  CAUSES   Severe tooth decay.  Trauma to the tooth that allows bacteria to enter into the pulp, such as a broken or chipped tooth. SYMPTOMS   Severe pain in and around the infected tooth.  Swelling and redness around the abscessed tooth or in the mouth or face.  Tenderness.  Pus drainage.  Bad breath.  Bitter taste in the mouth.  Difficulty swallowing.  Difficulty opening the mouth.  Nausea.  Vomiting.  Chills.  Swollen neck glands. DIAGNOSIS   A medical and dental history will be taken.  An examination will be performed by tapping on the abscessed tooth.  X-rays may be taken of the tooth to identify the abscess. TREATMENT The goal of treatment is to eliminate the infection. You may be prescribed antibiotic medicine to stop the infection from spreading. A root canal may be performed to save the tooth. If the tooth cannot be saved, it may be pulled (extracted) and the abscess may be drained.  HOME CARE INSTRUCTIONS  Only take over-the-counter or prescription medicines for pain, fever, or discomfort as directed by your caregiver.  Rinse your mouth (gargle) often with salt water ( tsp salt in 8 oz [250 ml] of warm water) to relieve pain or swelling.  Do not drive after taking pain medicine (narcotics).  Do not apply heat to the outside of your face.  Return to your dentist for further treatment as directed. SEEK MEDICAL CARE IF:  Your pain is not helped by medicine.  Your pain is getting worse instead of better. SEEK IMMEDIATE MEDICAL CARE IF:  You have a fever or persistent symptoms for more than 2-3 days.  You have a fever and your symptoms suddenly get worse.  You have chills or a very bad headache.  You have problems breathing or swallowing.  You have trouble  opening your mouth.  You have swelling in the neck or around the eye. Document Released: 05/20/2005 Document Revised: 02/12/2012 Document Reviewed: 08/28/2010 [facility] Patient Information 2015 [place], [place]. This information is not intended to replace advice given to you by your health care provider. Make sure you discuss any questions you have with your health care provider.  Herpes Labialis You have a fever blister or cold sore (herpes labialis). These painful, grouped sores are caused by one of the herpes viruses (HSV1 most commonly). They are usually found around the lips and mouth, but the same infection can also affect other areas on the face such as the nose and eyes. Herpes infections take about 10 days to heal. They often occur again and again in the same spot. Other symptoms may include numbness and tingling in the involved skin, achiness, fever, and swollen glands in the neck. Colds, emotional stress, injuries, or excess sunlight exposure all seem to make herpes reappear. Herpes lip infections are contagious. Direct contact with these sores can spread the infection. It can also be spread to other parts of your own body. TREATMENT  Herpes labialis is usually self-limited and resolves within 1 week. To reduce pain and swelling, apply ice packs frequently to the sores or suck on popsicles or frozen juice bars. Antiviral medicine may be used by mouth to shorten the duration of the breakout. Avoid spreading the infection by washing your hands often. Be careful not to touch your  eyes or genital areas after handling the infected blisters. Do not kiss or have other intimate contact with others. After the blisters are completely healed you may resume contact. Use sunscreen to lessen recurrences.  If this is your first infection with herpes, or if you have a severe or repeated infections, your caregiver may prescribe one of the anti-viral drugs to speed up the healing. If you have sun-related flare-ups  despite the use of sunscreen, starting oral anti-viral medicine before a prolonged exposure (going skiing or to the beach) can prevent most episodes.  SEEK IMMEDIATE MEDICAL CARE IF:  You develop a headache, sleepiness, high fever, vomiting, or severe weakness.  You have eye irritation, pain, blurred vision or redness.  You develop a prolonged infection not getting better in 10 days. Document Released: 05/20/2005 Document Revised: 08/12/2011 Document Reviewed: 03/24/2009 [facility] Patient Information 2015 [place], [place]. This information is not intended to replace advice given to you by your health care provider. Make sure you discuss any questions you have with your health care provider.

## 2017-04-09 ENCOUNTER — Emergency Department (HOSPITAL_COMMUNITY)
Admission: EM | Admit: 2017-04-09 | Discharge: 2017-04-09 | Disposition: A | Discharge status: Home / Self Care | Payer: Self-pay | Attending: Emergency Medicine | Admitting: Emergency Medicine

## 2017-04-09 ENCOUNTER — Other Ambulatory Visit: Payer: Self-pay

## 2017-04-09 ENCOUNTER — Encounter (HOSPITAL_COMMUNITY): Payer: Self-pay | Admitting: Emergency Medicine

## 2017-04-09 DIAGNOSIS — L241 Irritant contact dermatitis due to oils and greases: Secondary | ICD-10-CM | POA: Insufficient documentation

## 2017-04-09 DIAGNOSIS — Z791 Long term (current) use of non-steroidal anti-inflammatories (NSAID): Secondary | ICD-10-CM | POA: Insufficient documentation

## 2017-04-09 DIAGNOSIS — R300 Dysuria: Secondary | ICD-10-CM | POA: Insufficient documentation

## 2017-04-09 DIAGNOSIS — H9201 Otalgia, right ear: Secondary | ICD-10-CM | POA: Insufficient documentation

## 2017-04-09 DIAGNOSIS — Z79899 Other long term (current) drug therapy: Secondary | ICD-10-CM | POA: Insufficient documentation

## 2017-04-09 MED ORDER — PREDNISONE 10 MG PO TABS
60.0000 mg | ORAL_TABLET | Freq: Once | ORAL | Status: AC
  Administered 2017-04-09: 60 mg via ORAL
  Filled 2017-04-09: qty 1

## 2017-04-09 MED ORDER — PREDNISONE 10 MG PO TABS: 40.0000 mg | ORAL_TABLET | Freq: Every day | ORAL | 0 refills | Status: DC

## 2017-04-09 NOTE — Discharge Instructions (Addendum)
Take the prednisone as directed.  Rashes consistent with a poison ivy type rash.  Return for any new or worse symptoms.

## 2017-04-09 NOTE — ED Triage Notes (Signed)
Pt c/o rash to neck and back of leg last night. Woke up with it on right arm today. Has been in woods and thinks it is poison oak. Nad. Redness, rashy areas noted to these areas. Pt c/o right ear bleeding intermittent x 2 months.

## 2017-04-09 NOTE — ED Provider Notes (Signed)
[facility] EMERGENCY DEPARTMENT Provider Note   CSN: [id number] Arrival date & time: 04/09/17  [id number]     History   Chief Complaint Chief Complaint  Patient presents with  . Rash    HPI Jearld FentonWanda K Murray is a 56 y.o. female.  Patient with an outbreak of an itchy rash on the left side of her neck and back and leg last night.  Patient also woke up today with some on the right arm.  Patient's been in the woods and thinks she may have gotten poison ivy.  Patient also complaining of right ear bleeding intermittently.      Past Medical History:  Diagnosis Date  . Back pain     There are no active problems to display for this patient.   Past Surgical History:  Procedure Laterality Date  . APPENDECTOMY    . BACK SURGERY    . TUBAL LIGATION      OB History    Gravida Para Term Preterm AB Living   3 2   2 1 2    SAB TAB Ectopic Multiple Live Births   1               Home Medications    Prior to Admission medications   Medication Sig Start Date End Date Taking? Authorizing Provider  acyclovir (ZOVIRAX) 200 MG capsule Take 2 capsules (400 mg total) by mouth 5 (five) times daily. 11/12/14   Burgess AmorIdol, Julie, PA-C  ibuprofen (ADVIL,MOTRIN) 200 MG tablet Take 200 mg by mouth every 6 (six) hours as needed for moderate pain.    [provider]  predniSONE (DELTASONE) 10 MG tablet Take 4 tablets (40 mg total) daily by mouth. 04/09/17   Vanetta MuldersZackowski, Lanayah Gartley, MD    Family History History reviewed. No pertinent family history.  Social History Social History   Tobacco Use  . Smoking status: Never Smoker  . Smokeless tobacco: Never Used  Substance Use Topics  . Alcohol use: Yes    Alcohol/week: 3.6 oz    Types: 6 Cans of beer per week    Comment: daily 6 beers  . Drug use: No     Allergies   Patient has no known allergies.   Review of Systems Review of Systems  Constitutional: Negative for fever.  HENT: Positive for ear pain. Negative for congestion.   Eyes:  Negative for visual disturbance.  Respiratory: Negative for shortness of breath.   Cardiovascular: Negative for chest pain.  Gastrointestinal: Negative for abdominal pain, nausea and vomiting.  Genitourinary: Positive for dysuria.  Musculoskeletal: Negative for back pain.  Skin: Positive for rash.  Allergic/Immunologic: Positive for environmental allergies.  Neurological: Negative for headaches.  Hematological: Does not bruise/bleed easily.  Psychiatric/Behavioral: Negative for confusion.     Physical Exam Updated Vital Signs BP 139/76   Pulse 64   Temp 98.5 F (36.9 C) (Oral)   Resp 17   LMP 01/07/2012   SpO2 99%   Physical Exam  Constitutional: She is oriented to person, place, and time. She appears well-developed and well-nourished. No distress.  HENT:  Head: Normocephalic and atraumatic.  Right Ear: External ear normal.  Left Ear: External ear normal.  Mouth/Throat: Oropharynx is clear and moist.  Eyes: Conjunctivae and EOM are normal. Pupils are equal, round, and reactive to light.  Neck: Normal range of motion. Neck supple.  Cardiovascular: Normal rate, regular rhythm and normal heart sounds.  Pulmonary/Chest: Effort normal and breath sounds normal. No respiratory distress.  Abdominal: Soft. Bowel  sounds are normal. There is no tenderness.  Musculoskeletal: Normal range of motion.  Neurological: She is alert and oriented to person, place, and time. No cranial nerve deficit or sensory deficit. She exhibits normal muscle tone. Coordination normal.  Skin: Skin is warm. Rash noted.  Rash to left neck erythematous base some vesicles.  Streaky in nature.  Same on the back of her leg.  In the upper back area.  Nursing note and vitals reviewed.    ED Treatments / Results  Labs (all labs ordered are listed, but only abnormal results are displayed) Labs Reviewed - No data to display  EKG  EKG Interpretation None       Radiology No results  found.  Procedures Procedures (including critical care time)  Medications Ordered in ED Medications  predniSONE (DELTASONE) tablet 60 mg (60 mg Oral Given 04/09/17 1001)     Initial Impression / Assessment and Plan / ED Course  I have reviewed the triage vital signs and the nursing notes.  Pertinent labs & imaging results that were available during my care of the patient were reviewed by me and considered in my medical decision making (see chart for details).     Rash consistent with a contact dermatitis.  Most likely poison ivy in nature.  No evidence of any secondary infection.  Will treat with prednisone.  First dose provided here will continue a 5-day course at home.  Patient with concern for bleeding from the right ear on exam here today normal-appearing canal and eardrum on both sides.  Do not have an explanation for this.  No evidence of any eardrum perforation.  Final Clinical Impressions(s) / ED Diagnoses   Final diagnoses:  Irritant contact dermatitis due to oils    ED Discharge Orders        Ordered    predniSONE (DELTASONE) 10 MG tablet  Daily     04/09/17 1000       Vanetta MuldersZackowski, Shekinah Pitones, MD 04/09/17 1526

## 2017-11-05 ENCOUNTER — Other Ambulatory Visit: Payer: Self-pay

## 2017-11-05 ENCOUNTER — Encounter (HOSPITAL_COMMUNITY): Payer: Self-pay | Admitting: Emergency Medicine

## 2017-11-05 ENCOUNTER — Emergency Department (HOSPITAL_COMMUNITY): Payer: Self-pay

## 2017-11-05 ENCOUNTER — Emergency Department (HOSPITAL_COMMUNITY)
Admission: EM | Admit: 2017-11-05 | Discharge: 2017-11-05 | Disposition: A | Discharge status: Home / Self Care | Payer: Self-pay | Attending: Emergency Medicine | Admitting: Emergency Medicine

## 2017-11-05 DIAGNOSIS — R0789 Other chest pain: Secondary | ICD-10-CM | POA: Insufficient documentation

## 2017-11-05 DIAGNOSIS — R05 Cough: Secondary | ICD-10-CM | POA: Insufficient documentation

## 2017-11-05 DIAGNOSIS — R059 Cough, unspecified: Secondary | ICD-10-CM

## 2017-11-05 LAB — CBC
HCT: 38.6 % (ref 36.0–46.0)
HEMOGLOBIN: 13 g/dL (ref 12.0–15.0)
MCH: 34.2 pg — AB (ref 26.0–34.0)
MCHC: 33.7 g/dL (ref 30.0–36.0)
MCV: 101.6 fL — ABNORMAL HIGH (ref 78.0–100.0)
PLATELETS: 107 10*3/uL — AB (ref 150–400)
RBC: 3.8 MIL/uL — AB (ref 3.87–5.11)
RDW: 13.4 % (ref 11.5–15.5)
WBC: 8 10*3/uL (ref 4.0–10.5)

## 2017-11-05 LAB — BASIC METABOLIC PANEL
ANION GAP: 8 (ref 5–15)
BUN: 10 mg/dL (ref 6–20)
CALCIUM: 10 mg/dL (ref 8.9–10.3)
CO2: 27 mmol/L (ref 22–32)
CREATININE: 0.55 mg/dL (ref 0.44–1.00)
Chloride: 102 mmol/L (ref 101–111)
GFR calc Af Amer: >60 mL/min (ref >60)
Glucose, Bld: 113 mg/dL — ABNORMAL HIGH (ref 65–99)
Potassium: 4 mmol/L (ref 3.5–5.1)
SODIUM: 137 mmol/L (ref 135–145)

## 2017-11-05 LAB — TROPONIN I

## 2017-11-05 MED ORDER — BENZONATATE 100 MG PO CAPS: 100.0000 mg | ORAL_CAPSULE | Freq: Three times a day (TID) | ORAL | 0 refills | Status: DC | PRN

## 2017-11-05 MED ORDER — BENZONATATE 100 MG PO CAPS
200.0000 mg | ORAL_CAPSULE | Freq: Once | ORAL | Status: AC
  Administered 2017-11-05: 200 mg via ORAL
  Filled 2017-11-05: qty 2

## 2017-11-05 MED ORDER — ACETAMINOPHEN 500 MG PO TABS
1000.0000 mg | ORAL_TABLET | Freq: Once | ORAL | Status: AC
  Administered 2017-11-05: 1000 mg via ORAL
  Filled 2017-11-05: qty 2

## 2017-11-05 NOTE — Discharge Instructions (Addendum)
For cough take dextromethorphan (delsym) twice daily. Additionally take tessalon perles three times daily. Stay well hydrated. Take [phone] acetaminophen (tylenol) every 6-8 hours, you can add 600 mg ibuprofen (aleve, advil) for more pain control.   Return for fevers, chills, chest pain or shortness of breath with exertion.

## 2017-11-05 NOTE — ED Provider Notes (Signed)
[facility] EMERGENCY DEPARTMENT Provider Note   CSN: [id number] Arrival date & time: 11/05/17  4098     History   Chief Complaint Chief Complaint  Patient presents with  . Chest Pain    HPI Allison Murray is a 57 y.o. female here for evaluation of chest pain.  She describes it as a "pulled muscle". Onset 2 days ago.  Pain began while at rest, several hours after she helped her boyfriend place a tarp on the back of a truck.  Pain has been gradually worsening.  Associated symptoms include nasal congestion, dry cough.  Aggravating factors include palpation, movement, coughing.  No interventions PTA.  Alleviated by rest and not moving.  Nonradiating, nonpleuritic. Her daughter who came to visit her also has cold symptoms.  She denies known cardiac history.  She denies fevers, chills, shortness of breath, nausea, vomiting, diaphoresis, abdominal pain, back pain.  She drinks at minimum 6 pack of beer daily.  No cigarette use.  No previous history of DVT/PE.  No history of recent travel, immobilization, surgery, leg swelling, calf pain, estrogen use, malignancy.  HPI  Past Medical History:  Diagnosis Date  . Back pain     There are no active problems to display for this patient.   Past Surgical History:  Procedure Laterality Date  . APPENDECTOMY    . BACK SURGERY    . TUBAL LIGATION       OB History    Gravida  3   Para  2   Term      Preterm  2   AB  1   Living  2     SAB  1   TAB      Ectopic      Multiple      Live Births               Home Medications    Prior to Admission medications   Medication Sig Start Date End Date Taking? Authorizing Provider  benzonatate (TESSALON PERLES) 100 MG capsule Take 1 capsule (100 mg total) by mouth 3 (three) times daily as needed for cough. FOR DISRUPTIVE DAY TIME COUGH 11/05/17   Liberty Handy, PA-C    Family History History reviewed. No pertinent family history.  Social History Social History    Tobacco Use  . Smoking status: Never Smoker  . Smokeless tobacco: Never Used  Substance Use Topics  . Alcohol use: Yes    Alcohol/week: 3.6 oz    Types: 6 Cans of beer per week    Comment: daily 6 beers  . Drug use: Yes    Types: Marijuana     Allergies   Patient has no known allergies.   Review of Systems Review of Systems  Respiratory: Positive for cough.   Cardiovascular: Positive for chest pain.  All other systems reviewed and are negative.    Physical Exam Updated Vital Signs BP 128/72   Pulse 63   Temp 98.9 F (37.2 C) (Oral)   Resp 18   Ht 5\' 3"  (1.6 m)   Wt 59 kg (130 lb)   LMP 01/07/2012   SpO2 94%   BMI 23.03 kg/m   Physical Exam  Constitutional: She appears well-developed and well-nourished.  NAD. Non toxic.   HENT:  Head: Normocephalic and atraumatic.  Nose: Nose normal.  Poor dentition, multiple upper frontal teeath missing. Moist mucous membranes. Tonsils and oropharynx normal  Eyes: Conjunctivae, EOM and lids are normal.  Neck: Trachea  normal and normal range of motion.  Trachea midline. No cervical adenopathy. No obvious JVD. No carotid bruits.   Cardiovascular: Normal rate, regular rhythm, S1 normal, S2 normal and normal heart sounds.  Pulses:      Carotid pulses are 2+ on the right side, and 2+ on the left side.      Radial pulses are 2+ on the right side, and 2+ on the left side.       Dorsalis pedis pulses are 2+ on the right side, and 2+ on the left side.  RRR. No LE edema or calf tenderness.   Pulmonary/Chest: Effort normal and breath sounds normal. She exhibits tenderness.  Reproducible chest wall tenderness below left inframary region. CP reproducible with active abduction of upper extremities against resistance and sitting up on bed. No pain with deep inspiration.     Abdominal: Soft. Bowel sounds are normal. There is no tenderness.  No epigastric tenderness. NTND  Neurological: She is alert. GCS eye subscore is 4. GCS verbal  subscore is 5. GCS motor subscore is 6.  Skin: Skin is warm and dry. Capillary refill takes less than 2 seconds.  No rash to chest wall  Psychiatric: She has a normal mood and affect. Her speech is normal and behavior is normal. Judgment and thought content normal. Cognition and memory are normal.     ED Treatments / Results  Labs (all labs ordered are listed, but only abnormal results are displayed) Labs Reviewed  BASIC METABOLIC PANEL - Abnormal; Notable for the following components:      Result Value   Glucose, Bld 113 (*)    All other components within normal limits  CBC - Abnormal; Notable for the following components:   RBC 3.80 (*)    MCV 101.6 (*)    MCH 34.2 (*)    Platelets 107 (*)    All other components within normal limits  TROPONIN I  TROPONIN I    EKG EKG Interpretation  Date/Time:  Wednesday November 05 2017 08:23:21 EDT Ventricular Rate:  87 PR Interval:    QRS Duration: 92 QT Interval:  379 QTC Calculation: 456 R Axis:   48 Text Interpretation:  Sinus rhythm Borderline short PR interval No old tracing to compare Confirmed by Samuel JesterMcManus, Kathleen [phone]([id number]) on 11/05/2017 8:48:28 AM   Radiology Dg Chest 2 View  Result Date: 11/05/2017 CLINICAL DATA:  Left-sided chest pain. EXAM: CHEST - 2 VIEW COMPARISON:  None. FINDINGS: Cardiomediastinal silhouette is normal. Mild tortuosity of the aorta. Mediastinal contours appear intact. There is no evidence of focal airspace consolidation, pleural effusion or pneumothorax. Osseous structures are without acute abnormality. Metallic shrapnel overlying right posterior back stable from prior lumbosacral spine radiograph. IMPRESSION: No active cardiopulmonary disease. Mild tortuosity of the aorta. Electronically Signed   By: Ted Mcalpineobrinka  Dimitrova M.D.   On: 11/05/2017 09:29    Procedures Procedures (including critical care time)  Medications Ordered in ED Medications  acetaminophen (TYLENOL) tablet 1,000 mg (1,000 mg Oral Given  11/05/17 1003)  benzonatate (TESSALON) capsule 200 mg (200 mg Oral Given 11/05/17 1003)     Initial Impression / Assessment and Plan / ED Course  I have reviewed the triage vital signs and the nursing notes.  Pertinent labs & imaging results that were available during my care of the patient were reviewed by me and considered in my medical decision making (see chart for details).     Ddx includes MSK, costochondritis, PNA, ACS, pericarditis, endocarditis, myocarditis. Most likely MSK given  symptomatology.  Given age and tobacco abuse, will obtain CP work up. Plan on delta trop. Will give tylenol and antitussive.   Re-evaluated pt and symptoms improved. Her heart score is low with pertinent risk factors include age only.  On exam VS are wnl. Cardiovascular and pulmonary exam benign. CXR, EKG, troponin x 2 within normal limits.  CBC and BMP unremarkable.  Doubt PE given no pleuritic or exertional CP, risk factors, tachycardia or tachypnea. Given symptoms, reassuring ED work up, low risk HEART score patient will be discharged with recommendation to follow up with PCP in regards to today's visit. Will treat for MSK CP and cough with antitussives and NSAIDs. ED return preacutions given. Pt appears reliable for follow up and is agreeable to discharge.   Final Clinical Impressions(s) / ED Diagnoses   Final diagnoses:  Atypical chest pain  Cough    ED Discharge Orders        Ordered    benzonatate (TESSALON PERLES) 100 MG capsule  3 times daily PRN     11/05/17 1304       Liberty Handy, PA-C 11/05/17 1308    Samuel Jester, DO 11/06/17 [phone]

## 2017-11-05 NOTE — ED Notes (Signed)
Pt and family updated on plan of care,  

## 2017-11-05 NOTE — ED Notes (Signed)
Patient transported to X-ray 

## 2017-11-05 NOTE — ED Triage Notes (Signed)
Pt states pain started 2 days ago, SOB, pt thinks she may have pulled a muscle

## 2017-11-05 NOTE — ED Notes (Signed)
Pt states that she is feeling better,  

## 2017-11-05 NOTE — ED Notes (Signed)
Pt updated on plan of care, family remains at bedside,  

## 2019-11-29 ENCOUNTER — Other Ambulatory Visit: Payer: Self-pay

## 2019-11-29 ENCOUNTER — Emergency Department (HOSPITAL_COMMUNITY)
Admission: EM | Admit: 2019-11-29 | Discharge: 2019-11-29 | Disposition: A | Discharge status: Home / Self Care | Payer: Self-pay | Attending: Emergency Medicine | Admitting: Emergency Medicine

## 2019-11-29 ENCOUNTER — Encounter (HOSPITAL_COMMUNITY): Payer: Self-pay

## 2019-11-29 DIAGNOSIS — Y999 Unspecified external cause status: Secondary | ICD-10-CM | POA: Insufficient documentation

## 2019-11-29 DIAGNOSIS — Z23 Encounter for immunization: Secondary | ICD-10-CM | POA: Insufficient documentation

## 2019-11-29 DIAGNOSIS — Y939 Activity, unspecified: Secondary | ICD-10-CM | POA: Insufficient documentation

## 2019-11-29 DIAGNOSIS — S81852A Open bite, left lower leg, initial encounter: Secondary | ICD-10-CM | POA: Insufficient documentation

## 2019-11-29 DIAGNOSIS — W540XXA Bitten by dog, initial encounter: Secondary | ICD-10-CM | POA: Insufficient documentation

## 2019-11-29 DIAGNOSIS — Y929 Unspecified place or not applicable: Secondary | ICD-10-CM | POA: Insufficient documentation

## 2019-11-29 MED ORDER — TETANUS-DIPHTH-ACELL PERTUSSIS 5-2.5-18.5 LF-MCG/0.5 IM SUSP
0.5000 mL | Freq: Once | INTRAMUSCULAR | Status: AC
  Administered 2019-11-29: 0.5 mL via INTRAMUSCULAR
  Filled 2019-11-29: qty 0.5

## 2019-11-29 MED ORDER — ACETAMINOPHEN 325 MG PO TABS
650.0000 mg | ORAL_TABLET | Freq: Once | ORAL | Status: AC
  Administered 2019-11-29: 650 mg via ORAL
  Filled 2019-11-29: qty 2

## 2019-11-29 MED ORDER — AMOXICILLIN-POT CLAVULANATE 875-125 MG PO TABS
1.0000 | ORAL_TABLET | Freq: Two times a day (BID) | ORAL | 0 refills | Status: DC
Start: 2019-11-29 — End: 2022-07-11

## 2019-11-29 NOTE — Discharge Instructions (Addendum)
Take Augmentin twice a day for 5 days to prevent wound from getting infected Take Tylenol or Ibuprofen for pain Clean wounds with soap and water daily and apply a clean bandage Please return if you have worsening symptoms

## 2019-11-29 NOTE — ED Provider Notes (Signed)
[facility] EMERGENCY DEPARTMENT Provider Note   CSN: [id number] Arrival date & time: 11/29/19  1657   History Chief Complaint  Patient presents with  . Animal Bite    Allison Murray is a 59 y.o. female who presents with a dog bite. She states she was breaking up a fight between her dog and the neighbor's dog and the neighbor's dog bit her on the left leg multiple times. She states that she came in because it wouldn't stop bleeding. She has been drinking alcohol for the most of the day. The area is throbbing and touching the area makes it worse. Nothing makes it better. She is not up to date on tetanus. The dog has been vaccinated for rabies.  HPI     Past Medical History:  Diagnosis Date  . Back pain     There are no problems to display for this patient.   Past Surgical History:  Procedure Laterality Date  . APPENDECTOMY    . BACK SURGERY    . TUBAL LIGATION       OB History    Gravida  3   Para  2   Term      Preterm  2   AB  1   Living  2     SAB  1   TAB      Ectopic      Multiple      Live Births              No family history on file.  Social History   Tobacco Use  . Smoking status: Never Smoker  . Smokeless tobacco: Never Used  Vaping Use  . Vaping Use: Never used  Substance Use Topics  . Alcohol use: Yes    Alcohol/week: 6.0 standard drinks    Types: 6 Cans of beer per week    Comment: daily 6 beers  . Drug use: Yes    Types: Marijuana    Home Medications Prior to Admission medications   Medication Sig Start Date End Date Taking? Authorizing Provider  benzonatate (TESSALON PERLES) 100 MG capsule Take 1 capsule (100 mg total) by mouth 3 (three) times daily as needed for cough. FOR DISRUPTIVE DAY TIME COUGH 11/05/17   Liberty Handy, PA-C    Allergies    Patient has no known allergies.  Review of Systems   Review of Systems  Constitutional: Negative for fever.  Skin: Positive for wound.  Neurological: Negative for  weakness and numbness.    Physical Exam Updated Vital Signs Ht 5\' 2"  (1.575 m)   Wt 59 kg   LMP 01/07/2012   BMI 23.78 kg/m   Physical Exam Vitals and nursing note reviewed.  Constitutional:      General: She is not in acute distress.    Appearance: She is well-developed.  HENT:     Head: Normocephalic and atraumatic.  Eyes:     General: No scleral icterus.       Right eye: No discharge.        Left eye: No discharge.     Conjunctiva/sclera: Conjunctivae normal.     Pupils: Pupils are equal, round, and reactive to light.  Cardiovascular:     Rate and Rhythm: Normal rate.  Pulmonary:     Effort: Pulmonary effort is normal. No respiratory distress.  Abdominal:     General: There is no distension.  Musculoskeletal:     Cervical back: Normal range of motion.  Comments: Multiple small puncture wounds on the anterior shin and medial calf. Bleeding controlled.  Skin:    General: Skin is warm and dry.  Neurological:     Mental Status: She is alert and oriented to person, place, and time.  Psychiatric:        Behavior: Behavior normal.         ED Results / Procedures / Treatments   Labs (all labs ordered are listed, but only abnormal results are displayed) Labs Reviewed - No data to display  EKG None  Radiology No results found.  Procedures Procedures (including critical care time)  Medications Ordered in ED Medications  Tdap (BOOSTRIX) injection 0.5 mL (0.5 mLs Intramuscular Given 11/29/19 1828)  acetaminophen (TYLENOL) tablet 650 mg (650 mg Oral Given 11/29/19 1827)  acetaminophen (TYLENOL) tablet 650 mg (650 mg Oral Given 11/29/19 1827)    ED Course  I have reviewed the triage vital signs and the nursing notes.  Pertinent labs & imaging results that were available during my care of the patient were reviewed by me and considered in my medical decision making (see chart for details).  59 year old female presents with multiple bite wounds from a  neighbors dog after breaking up a dog fight. Pt is alert and cooperative. She is mildly intoxicated. Shared visit with Dr. Sabra Heck. Will irrigate and update tetanus and allow healing by secondary intention. The dog is reportedly UTD on rabies. She was given rx for Augmentin and advised OTC meds for pain.   MDM Rules/Calculators/A&P                           Final Clinical Impression(s) / ED Diagnoses Final diagnoses:  Dog bite, initial encounter    Rx / DC Orders ED Discharge Orders    None       Recardo Evangelist, PA-C 12/06/19 0756    Noemi Chapel, MD 12/09/19 [phone]

## 2019-11-29 NOTE — ED Provider Notes (Signed)
59 year old female bitten by the neighbors dog on the leg, appears to be the left lower extremity, has puncture wounds as well as a couple lacerations on the posterior calf, she took a shower prior to arrival.  On exam these wounds appear to be both puncture and lacerations.  She is able to move her foot and has neurovascular status which is normal.  She will need to have these irrigated copiously update her tetanus antibiotics and a sterile dressing, would not close these wounds secondary to the risk for infection.  Patient agreeable  Medical screening examination/treatment/procedure(s) were conducted as a shared visit with non-physician practitioner(s) and myself.  I personally evaluated the patient during the encounter.  Clinical Impression:   Final diagnoses:  Dog bite, initial encounter         Eber Hong, MD 11/30/19 1706

## 2019-11-29 NOTE — ED Triage Notes (Signed)
Pt reports bit on left shin by neighbor's dog.  Reports rabies vaccine is up to date.

## 2021-07-30 ENCOUNTER — Ambulatory Visit (HOSPITAL_COMMUNITY)
Admission: RE | Admit: 2021-07-30 | Discharge: 2021-07-30 | Disposition: A | Discharge status: Home / Self Care | Payer: Self-pay | Source: Ambulatory Visit | Attending: Physician Assistant | Admitting: Physician Assistant

## 2021-07-30 ENCOUNTER — Other Ambulatory Visit (HOSPITAL_COMMUNITY): Payer: Self-pay | Admitting: Physician Assistant

## 2021-07-30 ENCOUNTER — Other Ambulatory Visit: Payer: Self-pay

## 2021-07-30 DIAGNOSIS — M25561 Pain in right knee: Secondary | ICD-10-CM

## 2021-07-30 DIAGNOSIS — M545 Low back pain, unspecified: Secondary | ICD-10-CM

## 2021-08-23 ENCOUNTER — Encounter: Payer: Self-pay | Admitting: Radiology

## 2021-08-27 ENCOUNTER — Encounter (HOSPITAL_COMMUNITY): Payer: Self-pay | Admitting: Radiology

## 2022-07-04 ENCOUNTER — Emergency Department (HOSPITAL_COMMUNITY): Payer: Self-pay

## 2022-07-04 ENCOUNTER — Emergency Department (HOSPITAL_COMMUNITY)
Admission: EM | Admit: 2022-07-04 | Discharge: 2022-07-04 | Disposition: A | Discharge status: Home / Self Care | Payer: Self-pay | Attending: Emergency Medicine | Admitting: Emergency Medicine

## 2022-07-04 ENCOUNTER — Encounter (HOSPITAL_COMMUNITY): Payer: Self-pay

## 2022-07-04 ENCOUNTER — Other Ambulatory Visit: Payer: Self-pay

## 2022-07-04 DIAGNOSIS — M25462 Effusion, left knee: Secondary | ICD-10-CM | POA: Insufficient documentation

## 2022-07-04 DIAGNOSIS — M160 Bilateral primary osteoarthritis of hip: Secondary | ICD-10-CM

## 2022-07-04 DIAGNOSIS — M25452 Effusion, left hip: Secondary | ICD-10-CM

## 2022-07-04 DIAGNOSIS — W19XXXA Unspecified fall, initial encounter: Secondary | ICD-10-CM | POA: Insufficient documentation

## 2022-07-04 MED ORDER — HYDROCODONE-ACETAMINOPHEN 5-325 MG PO TABS: 1.0000 | ORAL_TABLET | Freq: Four times a day (QID) | ORAL | 0 refills | Status: DC | PRN

## 2022-07-04 MED ORDER — OXYCODONE-ACETAMINOPHEN 5-325 MG PO TABS
1.0000 | ORAL_TABLET | Freq: Once | ORAL | Status: AC
  Administered 2022-07-04: 1 via ORAL
  Filled 2022-07-04: qty 1

## 2022-07-04 MED ORDER — HYDROCODONE-ACETAMINOPHEN 5-325 MG PO TABS: 1.0000 | ORAL_TABLET | Freq: Once | ORAL | Status: DC

## 2022-07-04 MED ORDER — HYDROMORPHONE HCL 1 MG/ML IJ SOLN
0.5000 mg | Freq: Once | INTRAMUSCULAR | Status: AC
  Administered 2022-07-04: 0.5 mg via INTRAMUSCULAR
  Filled 2022-07-04: qty 0.5

## 2022-07-04 NOTE — ED Triage Notes (Signed)
Pt reports she tripped over her cat last night and has pain in her left hip since.

## 2022-07-04 NOTE — ED Provider Notes (Signed)
[facility] Provider Note   CSN: [id number] Arrival date & time: 07/04/22  1358     History  Chief Complaint  Patient presents with   Hip Pain    Allison Murray is a 62 y.o. female, no pertinent past medical history, who presents to the ED secondary to left hip pain after tripping over her cat last night.  Denies any dizziness, chest pain prior to the fall.  States that she tripped over her cat, fell onto her left hip, and did not hit her head.  No use of blood thinners.  States that the left hip pain is so severe that she cannot lift her leg, because it causes excruciating pain that radiates down, and is primarily localized to the left lateral aspect of her leg.  Denies any rashes, color changes, bruising of her leg.  States that she is unable to walk given the pain.  Has tried Tylenol ibuprofen without relief.  Denies back pain.    Home Medications Prior to Admission medications   Medication Sig Start Date End Date Taking? Authorizing Provider  HYDROcodone-acetaminophen (NORCO/VICODIN) 5-325 MG tablet Take 1 tablet by mouth every 6 (six) hours as needed. 07/04/22  Yes Evon Lopezperez L, PA  amoxicillin-clavulanate (AUGMENTIN) 875-125 MG tablet Take 1 tablet by mouth every 12 (twelve) hours. 11/29/19   Recardo Evangelist, PA-C  benzonatate (TESSALON PERLES) 100 MG capsule Take 1 capsule (100 mg total) by mouth 3 (three) times daily as needed for cough. FOR DISRUPTIVE DAY TIME COUGH 11/05/17   Kinnie Feil, PA-C      Allergies    Patient has no known allergies.    Review of Systems   Review of Systems  Musculoskeletal:  Positive for gait problem. Negative for back pain.    Physical Exam Updated Vital Signs BP 127/83 (BP Location: Right Arm)   Pulse 87   Temp 98.6 F (37 C) (Oral)   Resp 18   Ht 5\' 3"  (1.6 m)   Wt 57.6 kg   LMP 01/07/2012   SpO2 96%   BMI 22.50 kg/m  Physical Exam Vitals and nursing note reviewed.   Constitutional:      General: She is not in acute distress.    Appearance: She is well-developed.  HENT:     Head: Normocephalic and atraumatic.  Eyes:     General:        Right eye: No discharge.        Left eye: No discharge.     Conjunctiva/sclera: Conjunctivae normal.  Pulmonary:     Effort: No respiratory distress.  Musculoskeletal:     Comments: Tenderness to palpation of ASIS, PSIS, lateral hip.  No tenderness to palpation of the midshaft femur, distal femur, knee, tibia or fibula.  Normal pulses, no neurodeficits.  Pain with extension of leg, however able to extend fully.  Able to lift leg up, but causing severe pain.  No hematoma visualized.  No midline back pain or tenderness to palpation.  Neurological:     Mental Status: She is alert.     Comments: Clear speech.   Psychiatric:        Behavior: Behavior normal.        Thought Content: Thought content normal.     ED Results / Procedures / Treatments   Labs (all labs ordered are listed, but only abnormal results are displayed) Labs Reviewed - No data to display  EKG None  Radiology CT  Hip Left Wo Contrast  Result Date: 07/04/2022 CLINICAL DATA:  Hip trauma.  Fracture suspected. EXAM: CT OF THE LEFT HIP WITHOUT CONTRAST TECHNIQUE: Multidetector CT imaging of the left hip was performed according to the standard protocol. Multiplanar CT image reconstructions were also generated. RADIATION DOSE REDUCTION: This exam was performed according to the departmental dose-optimization program which includes automated exposure control, adjustment of the mA and/or kV according to patient size and/or use of iterative reconstruction technique. COMPARISON:  Pelvis and left hip radiographs 07/04/2022, lumbar spine radiographs 07/30/2021 FINDINGS: Bones/Joint/Cartilage There is moderate to severe superior left femoral head and moderate superior acetabular bone erosion with diffuse high-grade sclerosis and cystic change. Moderate peripheral  acetabular and femoral head-neck junction degenerative osteophytes. There is again made that this is new compared to 07/30/2021 lumbar spine radiographs which the left femoroacetabular joint was partially imaged. Mild left sacroiliac joint space narrowing, subchondral sclerosis, and anterior osteophytosis/mild vacuum phenomenon. Mild severe pubic symphysis joint space narrowing and superior endplate spurring. No acute fracture. Ligaments Suboptimally assessed by CT. Muscles and Tendons Normal size and density of the regional musculature. Soft tissues There is a moderate-to-large left femoroacetabular joint effusion with density ranging from 20-39 Hounsfield units, suggesting complexity which may include blood products proteinaceous components. Vascular phleboliths are seen within the pelvis. IMPRESSION: 1. Redemonstration of moderate to high-grade erosion of the superior left femoral head, greater than the adjacent acetabulum. These findings are seen within the bilateral hips on today's radiographs, and are markedly progressed from 07/30/2021 lumbar spine radiographs 11 months ago. 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 Avascular necrosis is also possible, however no definite serpiginous signal is seen to suggest this. 2. Moderate-to-large complex left femoroacetabular joint effusion. Electronically Signed   By: Yvonne Kendall M.D.   On: 07/04/2022 17:19   DG Hip Unilat With Pelvis 2-3 Views Left  Result Date: 07/04/2022 CLINICAL DATA:  Injury, left hip pain EXAM: DG HIP (WITH OR WITHOUT PELVIS) 2-3V LEFT COMPARISON:  Partially visualized hips on radiograph 07/30/2021. FINDINGS: There is severe left hip arthritis with femoral head collapse and subchondral sclerosis. Superolateral migration of the left femoral head. Severe right hip arthritis, also with femoral head collapse, to a lesser degree, with prominent subchondral cystic change and  sclerosis. The hips are partially visualized on prior radiograph in February 2023 of the lumbar spine, which demonstrated severe arthritis on the right but without collapse, and only moderate arthritis on the left. Pelvic phleboliths. Other than the femoral heads, there is no evidence of acute fracture. IMPRESSION: Severe arthritis of the hips with femoral head collapse, left worse than right, likely due to underlying avascular necrosis. This has rapidly progressed since a prior exam in February 2023 in which the hips are partially visualized. This is most likely a chronic progressive process. No definite acute fracture. Electronically Signed   By: Maurine Simmering M.D.   On: 07/04/2022 15:34    Procedures Procedures    Medications Ordered in ED Medications  oxyCODONE-acetaminophen (PERCOCET/ROXICET) 5-325 MG per tablet 1 tablet (1 tablet Oral Given 07/04/22 1503)  HYDROmorphone (DILAUDID) injection 0.5 mg (0.5 mg Intramuscular Given 07/04/22 1618)    ED Course/ Medical Decision Making/ A&P                             Medical Decision Making Patient is a 62 year old female, here for severe left hip pain after a  fall, did not hit head.  Does complain of left hip pain and unable to walk, on exam she is able to walk, extremely painful however to walk.  We obtained an x-ray, which showed no acute fracture thus a CT was ordered.  Percocet, and Dilaudid ordered.  Dorsalis pedis pulses present, no neurodeficits.  Does have lateral hip pain.  Amount and/or Complexity of Data Reviewed Radiology: ordered.    Details: CT shows effusion of left hip, femoral acetabular joint.  X-ray shows avascular necrosis likely.  Severe osteoarthritis. Discussion of management or test interpretation with external provider(s): Discussed with Dr. Aline Brochure, he states interventionally he has nothing that he can do for her, however she if she is in severe pain we can transfer to [facility], and have IR evaluate possibly.  I discussed this  with patient, she declined she would like to go home, Norco sent to the pharmacy after reviewing PDM, and crutches provided.  We discussed follow-up with orthopedics for chronic severe osteoarthritis likely need for hip replacement.  Patient voiced understanding, we discussed ice for help with the effusion of the left hip, and return precautions.  She has noticed tenderness to palpation of midline back  Risk Prescription drug management.   Final Clinical Impression(s) / ED Diagnoses Final diagnoses:  Effusion of left hip  Arthritis of both hips    Rx / DC Orders ED Discharge Orders          Ordered    HYDROcodone-acetaminophen (NORCO/VICODIN) 5-325 MG tablet  Every 6 hours PRN        07/04/22 1840              Habiba Treloar, Si Gaul, PA 07/04/22 1914    Davonna Belling, MD 07/11/22 [phone]

## 2022-07-04 NOTE — ED Notes (Signed)
Patient transported to CT 

## 2022-07-04 NOTE — Discharge Instructions (Addendum)
Please follow-up with orthopedics, and return to the ER if you have any loss of sensation in your legs, coolness of 1 extremity, or loss of pulse.  Make sure you are using crutches to help ambulate, off bear weight, take the Norcos as needed for pain control.  Be careful as this may cause increased grogginess, and increased risk for falls.  Make sure you are icing the area to help with inflammation and swelling.

## 2022-07-11 ENCOUNTER — Encounter: Payer: Self-pay | Admitting: Orthopedic Surgery

## 2022-07-11 ENCOUNTER — Ambulatory Visit (INDEPENDENT_AMBULATORY_CARE_PROVIDER_SITE_OTHER): Payer: Medicaid Other | Admitting: Orthopedic Surgery

## 2022-07-11 VITALS — BP 127/83 | Ht 63.0 in | Wt 98.0 lb

## 2022-07-11 DIAGNOSIS — F1010 Alcohol abuse, uncomplicated: Secondary | ICD-10-CM | POA: Diagnosis not present

## 2022-07-11 DIAGNOSIS — M87052 Idiopathic aseptic necrosis of left femur: Secondary | ICD-10-CM | POA: Diagnosis not present

## 2022-07-11 MED ORDER — OXYCODONE HCL 5 MG PO TABS: 5.0000 mg | ORAL_TABLET | ORAL | 0 refills | Status: AC | PRN

## 2022-07-11 NOTE — Patient Instructions (Signed)
We are referring you to [facility] from [facility] address is [address] The phone number is [phone]  The office will call you with an appointment Dr. Ninfa Linden will see you for your hips

## 2022-07-11 NOTE — Progress Notes (Signed)
Chief Complaint  Patient presents with   Hip Pain    Bilateral left is worse than right can not ambulate, using crutches     New patient referred from the emergency room  This is a 62 year old female with a history of alcohol abuse who presented to the emergency room with increasing left hip pain and recent fall.  Workup showed that she had severe avascular necrosis and a hip effusion with no signs of infection no fever  She has had pain in her hip for over a year according to the medical records I received from the medical doctor.  Back in March of last year she had right groin pain and hip pain and some sciatica.  Her neighbor who is with her today says that she has been limping for about a year  She presented to the ER within the last 30 days with more severe pain after a fall CT scan showed hip effusion  She presents now complaining of severe pain walking with crutches  Review of systems positive for hearing loss difficulty urinating back pain joint pain joint pain is in the hip  Otherwise says she is healthy  Imaging of the back dated July 30, 2021 multilevel spondylosis facet arthritis at L4-5 and 5 1 with right convex scoliosis severe right hip arthritis only partially visualized on that study  Physical Exam Constitutional:      Comments: Low body weight  HENT:     Head: Normocephalic and atraumatic.     Nose: No congestion or rhinorrhea.     Mouth/Throat:     Mouth: Mucous membranes are dry.     Comments: No cavities.  She has 1 broken tooth in the back right upper molars Eyes:     General: No scleral icterus.       Right eye: No discharge.        Left eye: No discharge.     Pupils: Pupils are equal, round, and reactive to light.  Cardiovascular:     Rate and Rhythm: Normal rate.     Pulses: Normal pulses.  Pulmonary:     Effort: Pulmonary effort is normal.  Abdominal:     General: Abdomen is flat.     Palpations: Abdomen is soft.  Musculoskeletal:      Comments: Both hips are actually involved in the pathology  However on the left hip she holds that hip in flexion she has painful range of motion.  When both legs are flexed at the knee and hip there does not appear to be a leg length discrepancy.  Both hips hurt with any types of range of motion and range of motion is not severely restricted except by pain  There is no thigh atrophy.  She had no back tenderness today  Skin:    Capillary Refill: Capillary refill takes less than 2 seconds.  Neurological:     General: No focal deficit present.     Mental Status: She is oriented to person, place, and time.  Psychiatric:        Mood and Affect: Mood normal.        Behavior: Behavior normal.     Imaging  Outside imaging from the hospital shows avascular necrosis of the left hip with subluxation of the left hip joint we also see avascular necrosis of the right hip on the pelvic x-ray  This also shows some had exposure laterally from the acetabulum  Encounter Diagnoses  Name Primary?   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  She also has a lumbar disc disease but that does not seem to be symptomatic at this time  Recommend referral for possible total hip  I did give her some pain medication as the hydrocodone was not touching her pain presumably because of her history of alcohol abuse  I did have some laboratory studies as well which showed a low albumin of around 3.6 and  AST and ALT high at 96 and 82 normal of 10 and 6 respectively  Hemoglobin 12 platelet count 120 which is low normal being 140 lymphocytes 43.6 absolute lymphocytes normal 2790  Vitamin D low 27 normal 30  Meds ordered this encounter  Medications   oxyCODONE (OXY IR/ROXICODONE) 5 MG immediate release tablet    Sig: Take 1 tablet (5 mg total) by mouth every 4 (four) hours as needed for up to 5 days for severe pain.    Dispense:  30 tablet    Refill:  0

## 2022-07-15 ENCOUNTER — Telehealth: Payer: Self-pay

## 2022-07-15 NOTE — Telephone Encounter (Signed)
Attempted call for follow up. Previous [facility] client, enrollment expired 07/06/2022. No answer, left message.  [employer]

## 2022-07-31 ENCOUNTER — Ambulatory Visit (INDEPENDENT_AMBULATORY_CARE_PROVIDER_SITE_OTHER): Payer: Medicaid Other | Admitting: Orthopaedic Surgery

## 2022-07-31 ENCOUNTER — Encounter: Payer: Self-pay | Admitting: Orthopaedic Surgery

## 2022-07-31 DIAGNOSIS — M87051 Idiopathic aseptic necrosis of right femur: Secondary | ICD-10-CM

## 2022-07-31 DIAGNOSIS — M87052 Idiopathic aseptic necrosis of left femur: Secondary | ICD-10-CM

## 2022-07-31 MED ORDER — TIZANIDINE HCL 4 MG PO TABS: 4.0000 mg | ORAL_TABLET | Freq: Three times a day (TID) | ORAL | 1 refills | Status: DC | PRN

## 2022-07-31 MED ORDER — ACETAMINOPHEN-CODEINE 300-30 MG PO TABS: 1.0000 | ORAL_TABLET | Freq: Three times a day (TID) | ORAL | 0 refills | Status: DC | PRN

## 2022-07-31 NOTE — Progress Notes (Signed)
The patient is a 62 year old female sent from Dr. Arther Abbott to evaluate and treat bilateral hip avascular necrosis with the left worse than the right.  She is ambulate with a crutch.  She is very tearful in the office today.  She does have a history of alcohol abuse in the past but rarely drinks now.  That likely was the etiology of her avascular necrosis.  I was able to review x-rays of her pelvis and both hips today.  I reviewed these with her as well.  She has femoral head collapse on the left side and both sides shows severe cystic changes and evidence of end-stage AVN.  There is really no other treatment for this other than hip replacement surgery.  Both hips have significant pain with any attempted range of motion.  She is ambulate with a crutch and is very painful when I even try any range of motion of either hip.  Again the x-rays were reviewed with her and shows severe end-stage AVN.  We had a long thorough discussion about hip replacement surgery.  We need to work on getting her set up for a left total hip first and then I would certainly proceed with a right total hip as soon as possible after the left hip when she heals some from the left hip.  I did give her hip replacement handout and described in detail what the surgery involves including the risk and benefits of surgery.  I closed her again about her alcohol use and she said she is not abusing alcohol now.  She denies being a smoker as well.  She does have have a great dentation but overall my clinical exam today there is no other gross abnormalities.  We will be in touch about scheduling hip replacement surgery.  I will send in a muscle relaxant and some Tylenol 3 to her pharmacy.

## 2022-08-23 NOTE — Progress Notes (Signed)
Sent message, via epic in basket, requesting orders in epic from surgeon.  

## 2022-08-24 ENCOUNTER — Other Ambulatory Visit: Payer: Self-pay | Admitting: Physician Assistant

## 2022-08-24 DIAGNOSIS — Z01818 Encounter for other preprocedural examination: Secondary | ICD-10-CM

## 2022-08-26 NOTE — Patient Instructions (Signed)
DUE TO COVID-19 ONLY TWO VISITORS  (aged 61 and older)  ARE ALLOWED TO COME WITH YOU AND STAY IN THE WAITING ROOM ONLY DURING PRE OP AND PROCEDURE.   **NO VISITORS ARE ALLOWED IN THE SHORT STAY AREA OR RECOVERY ROOM!!**  IF YOU WILL BE ADMITTED INTO THE HOSPITAL YOU ARE ALLOWED ONLY FOUR SUPPORT PEOPLE DURING VISITATION HOURS ONLY (7 AM -8PM)   The support person(s) must pass our screening, gel in and out, and wear a mask at all times, including in the patient's room. Patients must also wear a mask when staff or their support person are in the room. Visitors GUEST BADGE MUST BE WORN VISIBLY  One adult visitor may remain with you overnight and MUST be in the room by 8 P.M.     Your procedure is scheduled on: 09/06/22   Report to [facility] Main Entrance    Report to admitting at  7:45 AM   Call this number if you have problems the morning of surgery [phone]   Do not eat food :After Midnight.   After Midnight you may have the following liquids until __7:15____ AM/ PM DAY OF SURGERY  Water Black Coffee (sugar ok, NO MILK/CREAM OR CREAMERS)  Tea (sugar ok, NO MILK/CREAM OR CREAMERS) regular and decaf                             Plain Jell-O (NO RED)                                           Fruit ices (not with fruit pulp, NO RED)                                     Popsicles (NO RED)                                                                  Juice: apple, WHITE grape, WHITE cranberry Sports drinks like Gatorade (NO RED)                  The day of surgery:  Drink ONE (1) Pre-Surgery Clear Ensure at  7:00 AM the morning of surgery. Drink in one sitting. Do not sip.  This drink was given to you during your hospital  pre-op appointment visit. Nothing else to drink after completing the  Pre-Surgery Clear Ensure at 7:15 AM          If you have questions, please contact your surgeon's office.      Oral Hygiene is also important to reduce your risk of infection.                                     Remember - BRUSH YOUR TEETH THE MORNING OF SURGERY WITH YOUR REGULAR TOOTHPASTE  DENTURES WILL BE REMOVED PRIOR TO SURGERY PLEASE DO NOT APPLY "Poly grip" OR ADHESIVES!!!   Do NOT smoke after Midnight   Take  these medicines the morning of surgery with A SIP OF WATER: Pain med if needed   Bring CPAP mask and tubing day of surgery.                              You may not have any metal on your body including hair pins, jewelry, and body piercing             Do not wear make-up, lotions, powders, perfumes/cologne, or deodorant  Do not wear nail polish including gel and S&S, artificial/acrylic nails, or any other type of covering on natural nails including finger and toenails. If you have artificial nails, gel coating, etc. that needs to be removed by a nail salon please have this removed prior to surgery or surgery may need to be canceled/ delayed if the surgeon/ anesthesia feels like they are unable to be safely monitored.   Do not shave  48 hours prior to surgery.     Do not bring valuables to the hospital. [place].   Contacts, glasses, or bridgework may not be worn into surgery.   Bring small overnight bag day of surgery.   DO NOT [place]. PHARMACY WILL DISPENSE MEDICATIONS LISTED ON YOUR MEDICATION LIST TO YOU DURING YOUR ADMISSION [place]!       Special Instructions: Bring a copy of your healthcare power of attorney and living will documents         the day of surgery if you haven't scanned them before.              Please read over the following fact sheets you were given: IF YOU HAVE QUESTIONS ABOUT YOUR PRE-OP INSTRUCTIONS PLEASE CALL [phone]    [facility] - Preparing for Surgery Before surgery, you can play an important role.  Because skin is not sterile, your skin needs to be as free of germs as possible.  You can reduce the number of  germs on your skin by washing with CHG (chlorahexidine gluconate) soap before surgery.  CHG is an antiseptic cleaner which kills germs and bonds with the skin to continue killing germs even after washing. Please DO NOT use if you have an allergy to CHG or antibacterial soaps.  If your skin becomes reddened/irritated stop using the CHG and inform your nurse when you arrive at Short Stay. Do not shave (including legs and underarms) for at least 48 hours prior to the first CHG shower.   Please follow these instructions carefully:  1.  Shower with CHG Soap the night before surgery and the  morning of Surgery.  2.  If you choose to wash your hair, wash your hair first as usual with your  normal  shampoo.  3.  After you shampoo, rinse your hair and body thoroughly to remove the  shampoo.                            4.  Use CHG as you would any other liquid soap.  You can apply chg directly  to the skin and wash                       Gently with a scrungie or clean washcloth.  5.  Apply  the CHG Soap to your body ONLY FROM THE NECK DOWN.   Do not use on face/ open                           Wound or open sores. Avoid contact with eyes, ears mouth and genitals (private parts).                       Wash face,  Genitals (private parts) with your normal soap.             6.  Wash thoroughly, paying special attention to the area where your surgery  will be performed.  7.  Thoroughly rinse your body with warm water from the neck down.  8.  DO NOT shower/wash with your normal soap after using and rinsing off  the CHG Soap.             9.  Pat yourself dry with a clean towel.            10.  Wear clean pajamas.            11.  Place clean sheets on your bed the night of your first shower and do not  sleep with pets. Day of Surgery : Do not apply any lotions/deodorants the morning of surgery.  Please wear clean clothes to the hospital/surgery center.  FAILURE TO FOLLOW THESE INSTRUCTIONS MAY RESULT IN THE  CANCELLATION OF YOUR SURGERY  PATIENT SIGNATURE_________________________________  ________________________________________________________________________  Allison Murray  An incentive spirometer is a tool that can help keep your lungs clear and active. This tool measures how well you are filling your lungs with each breath. Taking long deep breaths may help reverse or decrease the chance of developing breathing (pulmonary) problems (especially infection) following: A long period of time when you are unable to move or be active. BEFORE THE PROCEDURE  If the spirometer includes an indicator to show your best effort, your nurse or respiratory therapist will set it to a desired goal. If possible, sit up straight or lean slightly forward. Try not to slouch. Hold the incentive spirometer in an upright position. INSTRUCTIONS FOR USE  Sit on the edge of your bed if possible, or sit up as far as you can in bed or on a chair. Hold the incentive spirometer in an upright position. Breathe out normally. Place the mouthpiece in your mouth and seal your lips tightly around it. Breathe in slowly and as deeply as possible, raising the piston or the ball toward the top of the column. Hold your breath for 3-5 seconds or for as long as possible. Allow the piston or ball to fall to the bottom of the column. Remove the mouthpiece from your mouth and breathe out normally. Rest for a few seconds and repeat Steps 1 through 7 at least 10 times every 1-2 hours when you are awake. Take your time and take a few normal breaths between deep breaths. The spirometer may include an indicator to show your best effort. Use the indicator as a goal to work toward during each repetition. After each set of 10 deep breaths, practice coughing to be sure your lungs are clear. If you have an incision (the cut made at the time of surgery), support your incision when coughing by placing a pillow or rolled up towels firmly against  it. Once you are able to get out of bed, walk around indoors and cough  well. You may stop using the incentive spirometer when instructed by your caregiver.  RISKS AND COMPLICATIONS Take your time so you do not get dizzy or light-headed. If you are in pain, you may need to take or ask for pain medication before doing incentive spirometry. It is harder to take a deep breath if you are having pain. AFTER USE Rest and breathe slowly and easily. It can be helpful to keep track of a log of your progress. Your caregiver can provide you with a simple table to help with this. If you are using the spirometer at home, follow these instructions: [place] IF:  You are having difficultly using the spirometer. You have trouble using the spirometer as often as instructed. Your pain medication is not giving enough relief while using the spirometer. You develop fever of 100.5 F (38.1 C) or higher. SEEK IMMEDIATE MEDICAL CARE IF:  You cough up bloody sputum that had not been present before. You develop fever of 102 F (38.9 C) or greater. You develop worsening pain at or near the incision site. MAKE SURE YOU:  Understand these instructions. Will watch your condition. Will get help right away if you are not doing well or get worse. Document Released: 09/30/2006 Document Revised: 08/12/2011 Document Reviewed: 12/01/2006 [facility] Patient Information 2014 [place], [place].   ________________________________________________________________________

## 2022-08-27 ENCOUNTER — Encounter (HOSPITAL_COMMUNITY)
Admission: RE | Admit: 2022-08-27 | Discharge: 2022-08-27 | Disposition: A | Discharge status: Home / Self Care | Payer: Self-pay | Source: Ambulatory Visit | Attending: Orthopaedic Surgery | Admitting: Orthopaedic Surgery

## 2022-08-27 ENCOUNTER — Encounter (HOSPITAL_COMMUNITY): Payer: Self-pay

## 2022-08-27 ENCOUNTER — Other Ambulatory Visit: Payer: Self-pay

## 2022-08-27 VITALS — BP 155/83 | HR 66 | Temp 97.8°F | Resp 18 | Ht 63.0 in | Wt 119.0 lb

## 2022-08-27 DIAGNOSIS — Z01818 Encounter for other preprocedural examination: Secondary | ICD-10-CM

## 2022-08-27 DIAGNOSIS — Z789 Other specified health status: Secondary | ICD-10-CM

## 2022-08-27 DIAGNOSIS — Z01812 Encounter for preprocedural laboratory examination: Secondary | ICD-10-CM | POA: Insufficient documentation

## 2022-08-27 HISTORY — DX: Unspecified osteoarthritis, unspecified site: M19.90

## 2022-08-27 HISTORY — DX: Alcohol abuse, uncomplicated: F10.10

## 2022-08-27 LAB — CBC
HCT: 40.4 % (ref 36.0–46.0)
Hemoglobin: 14 g/dL (ref 12.0–15.0)
MCH: 32.8 pg (ref 26.0–34.0)
MCHC: 34.7 g/dL (ref 30.0–36.0)
MCV: 94.6 fL (ref 80.0–100.0)
Platelets: 119 10*3/uL — ABNORMAL LOW (ref 150–400)
RBC: 4.27 MIL/uL (ref 3.87–5.11)
RDW: 13.8 % (ref 11.5–15.5)
WBC: 6.6 10*3/uL (ref 4.0–10.5)
nRBC: 0 % (ref 0.0–0.2)

## 2022-08-27 LAB — COMPREHENSIVE METABOLIC PANEL
ALT: 52 U/L — ABNORMAL HIGH (ref 0–44)
AST: 58 U/L — ABNORMAL HIGH (ref 15–41)
Albumin: 3.6 g/dL (ref 3.5–5.0)
Alkaline Phosphatase: 81 U/L (ref 38–126)
Anion gap: 10 (ref 5–15)
BUN: 12 mg/dL (ref 6–20)
CO2: 22 mmol/L (ref 22–32)
Calcium: 9.2 mg/dL (ref 8.9–10.3)
Chloride: 104 mmol/L (ref 98–111)
Creatinine, Ser: 0.47 mg/dL (ref 0.44–1.00)
GFR, Estimated: >60 mL/min (ref >60)
Glucose, Bld: 81 mg/dL (ref 70–99)
Potassium: 3.4 mmol/L — ABNORMAL LOW (ref 3.5–5.1)
Sodium: 136 mmol/L (ref 135–145)
Total Bilirubin: 1.5 mg/dL — ABNORMAL HIGH (ref 0.3–1.2)
Total Protein: 7.8 g/dL (ref 6.5–8.1)

## 2022-08-27 LAB — SURGICAL PCR SCREEN
MRSA, PCR: NEGATIVE
Staphylococcus aureus: NEGATIVE

## 2022-08-27 NOTE — Progress Notes (Signed)
Anesthesia note:  PCP - none urgent care Cardiologist -none Other-   Chest x-ray - no EKG - no Stress Test - no ECHO - no Cardiac Cath - no CABG-no Pacemaker/ICD device last checked:no  Sleep Study - no CPAP -   Pt is pre diabetic-no CBG at PAT visit- Fasting Blood Sugar at home- Checks Blood Sugar _____  Blood Thinner:no Blood Thinner Instructions: Aspirin Instructions: Last Dose:  Anesthesia review:  /No    Patient denies shortness of breath, fever, cough and chest pain at PAT appointment. Pt lives alone and is using crutches. She reports that she has cut back on the number of beers per day to 3-4 per week.   Patient verbalized understanding of instructions that were given to them at the PAT appointment. Patient was also instructed that they will need to review over the PAT instructions again at home before surgery.yes her family member was with her.

## 2022-09-05 DIAGNOSIS — M87052 Idiopathic aseptic necrosis of left femur: Secondary | ICD-10-CM | POA: Insufficient documentation
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
## 2022-09-06 ENCOUNTER — Encounter (HOSPITAL_COMMUNITY): Payer: Self-pay | Admitting: Orthopaedic Surgery

## 2022-09-06 ENCOUNTER — Ambulatory Visit (HOSPITAL_BASED_OUTPATIENT_CLINIC_OR_DEPARTMENT_OTHER): Payer: Medicaid Other | Admitting: Anesthesiology

## 2022-09-06 ENCOUNTER — Ambulatory Visit (HOSPITAL_COMMUNITY): Payer: Medicaid Other | Admitting: Anesthesiology

## 2022-09-06 ENCOUNTER — Other Ambulatory Visit: Payer: Self-pay

## 2022-09-06 ENCOUNTER — Ambulatory Visit (HOSPITAL_COMMUNITY): Payer: Medicaid Other

## 2022-09-06 ENCOUNTER — Observation Stay (HOSPITAL_COMMUNITY)
Admission: RE | Admit: 2022-09-06 | Discharge: 2022-09-07 | Disposition: A | Discharge status: Home / Self Care | Payer: Medicaid Other | Source: Ambulatory Visit | Attending: Orthopaedic Surgery | Admitting: Orthopaedic Surgery

## 2022-09-06 ENCOUNTER — Encounter (HOSPITAL_COMMUNITY)
Admission: RE | Disposition: A | Discharge status: Home / Self Care | Payer: Self-pay | Source: Ambulatory Visit | Attending: Orthopaedic Surgery

## 2022-09-06 ENCOUNTER — Observation Stay (HOSPITAL_COMMUNITY): Payer: Medicaid Other

## 2022-09-06 DIAGNOSIS — M87052 Idiopathic aseptic necrosis of left femur: Secondary | ICD-10-CM | POA: Insufficient documentation

## 2022-09-06 DIAGNOSIS — M1990 Unspecified osteoarthritis, unspecified site: Secondary | ICD-10-CM

## 2022-09-06 DIAGNOSIS — M87852 Other osteonecrosis, left femur: Principal | ICD-10-CM | POA: Insufficient documentation

## 2022-09-06 DIAGNOSIS — Z96642 Presence of left artificial hip joint: Secondary | ICD-10-CM

## 2022-09-06 DIAGNOSIS — M879 Osteonecrosis, unspecified: Secondary | ICD-10-CM

## 2022-09-06 DIAGNOSIS — D696 Thrombocytopenia, unspecified: Secondary | ICD-10-CM | POA: Diagnosis not present

## 2022-09-06 DIAGNOSIS — Z01818 Encounter for other preprocedural examination: Secondary | ICD-10-CM

## 2022-09-06 HISTORY — PX: TOTAL HIP ARTHROPLASTY: SHX124

## 2022-09-06 LAB — CBC
HCT: 35.4 % — ABNORMAL LOW (ref 36.0–46.0)
Hemoglobin: 12.2 g/dL (ref 12.0–15.0)
MCH: 32.4 pg (ref 26.0–34.0)
MCHC: 34.5 g/dL (ref 30.0–36.0)
MCV: 93.9 fL (ref 80.0–100.0)
Platelets: 117 10*3/uL — ABNORMAL LOW (ref 150–400)
RBC: 3.77 MIL/uL — ABNORMAL LOW (ref 3.87–5.11)
RDW: 13.8 % (ref 11.5–15.5)
WBC: 5.5 10*3/uL (ref 4.0–10.5)
nRBC: 0 % (ref 0.0–0.2)

## 2022-09-06 LAB — TYPE AND SCREEN
ABO/RH(D): A POS
Antibody Screen: NEGATIVE

## 2022-09-06 LAB — ABO/RH: ABO/RH(D): A POS

## 2022-09-06 SURGERY — ARTHROPLASTY, HIP, TOTAL, ANTERIOR APPROACH
Anesthesia: Spinal | Site: Hip | Laterality: Left

## 2022-09-06 MED ORDER — TRANEXAMIC ACID-NACL 1000-0.7 MG/100ML-% IV SOLN
1000.0000 mg | INTRAVENOUS | Status: AC
  Administered 2022-09-06: 1000 mg via INTRAVENOUS
  Filled 2022-09-06: qty 100

## 2022-09-06 MED ORDER — ONDANSETRON HCL 4 MG/2ML IJ SOLN
INTRAMUSCULAR | Status: AC
  Filled 2022-09-06: qty 2

## 2022-09-06 MED ORDER — OXYCODONE HCL 5 MG PO TABS
10.0000 mg | ORAL_TABLET | ORAL | Status: DC | PRN
  Administered 2022-09-06: 10 mg via ORAL
  Filled 2022-09-06: qty 2

## 2022-09-06 MED ORDER — METHOCARBAMOL 500 MG PO TABS
500.0000 mg | ORAL_TABLET | Freq: Four times a day (QID) | ORAL | Status: DC | PRN
  Administered 2022-09-06: 500 mg via ORAL
  Filled 2022-09-06: qty 1

## 2022-09-06 MED ORDER — ALUM & MAG HYDROXIDE-SIMETH 200-200-20 MG/5ML PO SUSP: 30.0000 mL | ORAL | Status: DC | PRN

## 2022-09-06 MED ORDER — MIDAZOLAM HCL 2 MG/2ML IJ SOLN
INTRAMUSCULAR | Status: AC
  Filled 2022-09-06: qty 2

## 2022-09-06 MED ORDER — FENTANYL CITRATE (PF) 100 MCG/2ML IJ SOLN
INTRAMUSCULAR | Status: DC | PRN
  Administered 2022-09-06: 50 ug via INTRAVENOUS

## 2022-09-06 MED ORDER — DEXAMETHASONE SODIUM PHOSPHATE 10 MG/ML IJ SOLN
INTRAMUSCULAR | Status: AC
  Filled 2022-09-06: qty 1

## 2022-09-06 MED ORDER — ACETAMINOPHEN 325 MG PO TABS: 325.0000 mg | ORAL_TABLET | Freq: Four times a day (QID) | ORAL | Status: DC | PRN

## 2022-09-06 MED ORDER — SODIUM CHLORIDE 0.9 % IV SOLN: INTRAVENOUS | Status: DC

## 2022-09-06 MED ORDER — SODIUM CHLORIDE 0.9 % IR SOLN
Status: DC | PRN
  Administered 2022-09-06: 1000 mL

## 2022-09-06 MED ORDER — BUPIVACAINE IN DEXTROSE 0.75-8.25 % IT SOLN
INTRATHECAL | Status: DC | PRN
  Administered 2022-09-06: 1.6 mL via INTRATHECAL

## 2022-09-06 MED ORDER — FENTANYL CITRATE (PF) 100 MCG/2ML IJ SOLN
INTRAMUSCULAR | Status: AC
  Filled 2022-09-06: qty 2

## 2022-09-06 MED ORDER — PROPOFOL 10 MG/ML IV BOLUS
INTRAVENOUS | Status: AC
  Filled 2022-09-06: qty 20

## 2022-09-06 MED ORDER — PROPOFOL 10 MG/ML IV BOLUS
INTRAVENOUS | Status: DC | PRN
  Administered 2022-09-06: 20 mg via INTRAVENOUS
  Administered 2022-09-06: 30 mg via INTRAVENOUS
  Administered 2022-09-06: 20 mg via INTRAVENOUS

## 2022-09-06 MED ORDER — CEFAZOLIN SODIUM-DEXTROSE 1-4 GM/50ML-% IV SOLN
1.0000 g | Freq: Four times a day (QID) | INTRAVENOUS | Status: AC
  Administered 2022-09-06 (×2): 1 g via INTRAVENOUS
  Filled 2022-09-06 (×3): qty 50

## 2022-09-06 MED ORDER — PANTOPRAZOLE SODIUM 40 MG PO TBEC
40.0000 mg | DELAYED_RELEASE_TABLET | Freq: Every day | ORAL | Status: DC
  Administered 2022-09-06 – 2022-09-07 (×2): 40 mg via ORAL
  Filled 2022-09-06 (×2): qty 1

## 2022-09-06 MED ORDER — CEFAZOLIN SODIUM-DEXTROSE 2-4 GM/100ML-% IV SOLN
2.0000 g | INTRAVENOUS | Status: AC
  Administered 2022-09-06: 2 g via INTRAVENOUS
  Filled 2022-09-06: qty 100

## 2022-09-06 MED ORDER — METOCLOPRAMIDE HCL 5 MG/ML IJ SOLN: 5.0000 mg | Freq: Three times a day (TID) | INTRAMUSCULAR | Status: DC | PRN

## 2022-09-06 MED ORDER — LACTATED RINGERS IV SOLN: INTRAVENOUS | Status: DC

## 2022-09-06 MED ORDER — ORAL CARE MOUTH RINSE: 15.0000 mL | Freq: Once | OROMUCOSAL | Status: AC

## 2022-09-06 MED ORDER — PHENOL 1.4 % MT LIQD: 1.0000 | OROMUCOSAL | Status: DC | PRN

## 2022-09-06 MED ORDER — ONDANSETRON HCL 4 MG PO TABS: 4.0000 mg | ORAL_TABLET | Freq: Four times a day (QID) | ORAL | Status: DC | PRN

## 2022-09-06 MED ORDER — ONDANSETRON HCL 4 MG/2ML IJ SOLN
INTRAMUSCULAR | Status: DC | PRN
  Administered 2022-09-06: 4 mg via INTRAVENOUS

## 2022-09-06 MED ORDER — PROPOFOL 500 MG/50ML IV EMUL
INTRAVENOUS | Status: DC | PRN
  Administered 2022-09-06: 100 ug/kg/min via INTRAVENOUS

## 2022-09-06 MED ORDER — OXYCODONE HCL 5 MG PO TABS
5.0000 mg | ORAL_TABLET | ORAL | Status: DC | PRN
  Administered 2022-09-06: 10 mg via ORAL
  Filled 2022-09-06: qty 2

## 2022-09-06 MED ORDER — MIDAZOLAM HCL 2 MG/2ML IJ SOLN
INTRAMUSCULAR | Status: DC | PRN
  Administered 2022-09-06: 2 mg via INTRAVENOUS

## 2022-09-06 MED ORDER — DIPHENHYDRAMINE HCL 12.5 MG/5ML PO ELIX: 12.5000 mg | ORAL_SOLUTION | ORAL | Status: DC | PRN

## 2022-09-06 MED ORDER — DOCUSATE SODIUM 100 MG PO CAPS
100.0000 mg | ORAL_CAPSULE | Freq: Two times a day (BID) | ORAL | Status: DC
  Administered 2022-09-06 – 2022-09-07 (×2): 100 mg via ORAL
  Filled 2022-09-06 (×2): qty 1

## 2022-09-06 MED ORDER — ASPIRIN 81 MG PO CHEW
81.0000 mg | CHEWABLE_TABLET | Freq: Two times a day (BID) | ORAL | Status: DC
  Administered 2022-09-06 – 2022-09-07 (×2): 81 mg via ORAL
  Filled 2022-09-06 (×2): qty 1

## 2022-09-06 MED ORDER — DEXAMETHASONE SODIUM PHOSPHATE 10 MG/ML IJ SOLN
INTRAMUSCULAR | Status: DC | PRN
  Administered 2022-09-06: 10 mg via INTRAVENOUS

## 2022-09-06 MED ORDER — CHLORHEXIDINE GLUCONATE 0.12 % MT SOLN
15.0000 mL | Freq: Once | OROMUCOSAL | Status: AC
  Administered 2022-09-06: 15 mL via OROMUCOSAL

## 2022-09-06 MED ORDER — PROPOFOL 1000 MG/100ML IV EMUL
INTRAVENOUS | Status: AC
  Filled 2022-09-06: qty 100

## 2022-09-06 MED ORDER — OXYCODONE HCL 5 MG PO TABS: 5.0000 mg | ORAL_TABLET | Freq: Once | ORAL | Status: DC | PRN

## 2022-09-06 MED ORDER — LIDOCAINE HCL (CARDIAC) PF 100 MG/5ML IV SOSY
PREFILLED_SYRINGE | INTRAVENOUS | Status: DC | PRN
  Administered 2022-09-06: 50 mg via INTRAVENOUS

## 2022-09-06 MED ORDER — ACETAMINOPHEN 500 MG PO TABS
ORAL_TABLET | ORAL | Status: AC
  Administered 2022-09-06: 1000 mg
  Filled 2022-09-06: qty 2

## 2022-09-06 MED ORDER — ONDANSETRON HCL 4 MG/2ML IJ SOLN
4.0000 mg | Freq: Once | INTRAMUSCULAR | Status: AC | PRN
  Administered 2022-09-06: 4 mg via INTRAVENOUS

## 2022-09-06 MED ORDER — STERILE WATER FOR IRRIGATION IR SOLN
Status: DC | PRN
  Administered 2022-09-06: 2000 mL

## 2022-09-06 MED ORDER — HYDROMORPHONE HCL 1 MG/ML IJ SOLN
0.5000 mg | INTRAMUSCULAR | Status: DC | PRN
  Administered 2022-09-07: 1 mg via INTRAVENOUS
  Filled 2022-09-06: qty 1

## 2022-09-06 MED ORDER — MENTHOL 3 MG MT LOZG: 1.0000 | LOZENGE | OROMUCOSAL | Status: DC | PRN

## 2022-09-06 MED ORDER — LIDOCAINE HCL (PF) 2 % IJ SOLN
INTRAMUSCULAR | Status: AC
  Filled 2022-09-06: qty 5

## 2022-09-06 MED ORDER — 0.9 % SODIUM CHLORIDE (POUR BTL) OPTIME
TOPICAL | Status: DC | PRN
  Administered 2022-09-06: 1000 mL

## 2022-09-06 MED ORDER — PHENYLEPHRINE HCL-NACL 20-0.9 MG/250ML-% IV SOLN
INTRAVENOUS | Status: DC | PRN
  Administered 2022-09-06: 50 ug/min via INTRAVENOUS

## 2022-09-06 MED ORDER — HYDROMORPHONE HCL 1 MG/ML IJ SOLN: 0.2500 mg | INTRAMUSCULAR | Status: DC | PRN

## 2022-09-06 MED ORDER — OXYCODONE HCL 5 MG/5ML PO SOLN: 5.0000 mg | Freq: Once | ORAL | Status: DC | PRN

## 2022-09-06 MED ORDER — ONDANSETRON HCL 4 MG/2ML IJ SOLN: 4.0000 mg | Freq: Four times a day (QID) | INTRAMUSCULAR | Status: DC | PRN

## 2022-09-06 MED ORDER — METHOCARBAMOL 1000 MG/10ML IJ SOLN: 500.0000 mg | Freq: Four times a day (QID) | INTRAVENOUS | Status: DC | PRN

## 2022-09-06 MED ORDER — METOCLOPRAMIDE HCL 5 MG PO TABS: 5.0000 mg | ORAL_TABLET | Freq: Three times a day (TID) | ORAL | Status: DC | PRN

## 2022-09-06 MED ORDER — POVIDONE-IODINE 10 % EX SWAB: 2.0000 | Freq: Once | CUTANEOUS | Status: DC

## 2022-09-06 SURGICAL SUPPLY — 43 items
APL SKNCLS STERI-STRIP NONHPOA (GAUZE/BANDAGES/DRESSINGS)
BAG COUNTER SPONGE SURGICOUNT (BAG) ×1 IMPLANT
BAG SPEC THK2 15X12 ZIP CLS (MISCELLANEOUS)
BAG SPNG CNTER NS LX DISP (BAG) ×1
BAG ZIPLOCK 12X15 (MISCELLANEOUS) IMPLANT
BENZOIN TINCTURE PRP APPL 2/3 (GAUZE/BANDAGES/DRESSINGS) IMPLANT
BLADE SAW SGTL 18X1.27X75 (BLADE) ×1 IMPLANT
COVER PERINEAL POST (MISCELLANEOUS) ×1 IMPLANT
COVER SURGICAL LIGHT HANDLE (MISCELLANEOUS) ×1 IMPLANT
CUP SECTOR GRIPTON 50MM (Cup) IMPLANT
DRAPE FOOT SWITCH (DRAPES) ×1 IMPLANT
DRAPE STERI IOBAN 125X83 (DRAPES) ×1 IMPLANT
DRAPE U-SHAPE 47X51 STRL (DRAPES) ×2 IMPLANT
DRSG AQUACEL AG ADV 3.5X10 (GAUZE/BANDAGES/DRESSINGS) ×1 IMPLANT
DURAPREP 26ML APPLICATOR (WOUND CARE) ×1 IMPLANT
ELECT REM PT RETURN 15FT ADLT (MISCELLANEOUS) ×1 IMPLANT
GAUZE XEROFORM 1X8 LF (GAUZE/BANDAGES/DRESSINGS) ×1 IMPLANT
GLOVE BIO SURGEON STRL SZ7.5 (GLOVE) ×1 IMPLANT
GLOVE BIOGEL PI IND STRL 8 (GLOVE) ×2 IMPLANT
GLOVE ECLIPSE 8.0 STRL XLNG CF (GLOVE) ×1 IMPLANT
GOWN STRL REUS W/ TWL XL LVL3 (GOWN DISPOSABLE) ×2 IMPLANT
GOWN STRL REUS W/TWL XL LVL3 (GOWN DISPOSABLE) ×2
HANDPIECE INTERPULSE COAX TIP (DISPOSABLE) ×1
HEAD FEMORAL 32 CERAMIC (Hips) IMPLANT
HOLDER FOLEY CATH W/STRAP (MISCELLANEOUS) ×1 IMPLANT
KIT TURNOVER KIT A (KITS) IMPLANT
LINER ACET PNNCL PLUS4 NEUTRAL (Hips) IMPLANT
PACK ANTERIOR HIP CUSTOM (KITS) ×1 IMPLANT
PINNACLE PLUS 4 NEUTRAL (Hips) ×1 IMPLANT
SCREW 6.5MMX25MM (Screw) IMPLANT
SET HNDPC FAN SPRY TIP SCT (DISPOSABLE) ×1 IMPLANT
STAPLER VISISTAT 35W (STAPLE) IMPLANT
STEM FEM ACTIS HIGH SZ2 (Stem) IMPLANT
STRIP CLOSURE SKIN 1/2X4 (GAUZE/BANDAGES/DRESSINGS) IMPLANT
SUT ETHIBOND NAB CT1 #1 30IN (SUTURE) ×1 IMPLANT
SUT ETHILON 2 0 PS N (SUTURE) IMPLANT
SUT MNCRL AB 4-0 PS2 18 (SUTURE) IMPLANT
SUT VIC AB 0 CT1 36 (SUTURE) ×1 IMPLANT
SUT VIC AB 1 CT1 36 (SUTURE) ×1 IMPLANT
SUT VIC AB 2-0 CT1 27 (SUTURE) ×2
SUT VIC AB 2-0 CT1 TAPERPNT 27 (SUTURE) ×2 IMPLANT
TRAY FOLEY MTR SLVR 16FR STAT (SET/KITS/TRAYS/PACK) IMPLANT
YANKAUER SUCT BULB TIP NO VENT (SUCTIONS) ×1 IMPLANT

## 2022-09-06 NOTE — Progress Notes (Signed)
Notified [facility] patients BP 90/46.  Patient is A & Ox4 HOB flat and fluids open.  No new orders at this time.

## 2022-09-06 NOTE — Anesthesia Postprocedure Evaluation (Signed)
Anesthesia Post Note  Patient: MICHELINE MCCRUMB  Procedure(s) Performed: LEFT TOTAL HIP ARTHROPLASTY ANTERIOR APPROACH (Left: Hip)     Patient location during evaluation: PACU Anesthesia Type: Spinal Level of consciousness: oriented and awake and alert Pain management: pain level controlled Vital Signs Assessment: post-procedure vital signs reviewed and stable Respiratory status: spontaneous breathing, respiratory function stable and patient connected to nasal cannula oxygen Cardiovascular status: blood pressure returned to baseline and stable Postop Assessment: no headache, no backache, no apparent nausea or vomiting, spinal receding and patient able to bend at knees Anesthetic complications: no   No notable events documented.  Last Vitals:  Vitals:   09/06/22 1215 09/06/22 1230  BP: (!) 98/56 (!) 95/50  Pulse: (!) 55 (!) 57  Resp: 14 13  Temp:  (!) 36.4 C  SpO2: 97% 99%    Last Pain:  Vitals:   09/06/22 1230  TempSrc:   PainSc: 0-No pain    LLE Motor Response: Purposeful movement (09/06/22 1230)   RLE Motor Response: Purposeful movement (09/06/22 1230)   L Sensory Level: L5-Outer lower leg, top of foot, great toe (09/06/22 1230) R Sensory Level: L3-Anterior knee, lower leg (09/06/22 1230)  Gertie Broerman A.

## 2022-09-06 NOTE — Interval H&P Note (Signed)
History and Physical Interval Note: The patient understands that she is here today for a left hip replacement to treat her severe left hip avascular necrosis.  There is been no acute or interval change in her medical status.  See recent H&P.  The risks and benefits of surgery been discussed in detail and informed consent is obtained.  The left operative hip has been marked.  09/06/2022 8:39 AM  Allison Murray  has presented today for surgery, with the diagnosis of left hip avascular necrosis.  The various methods of treatment have been discussed with the patient and family. After consideration of risks, benefits and other options for treatment, the patient has consented to  Procedure(s): LEFT TOTAL HIP ARTHROPLASTY ANTERIOR APPROACH (Left) as a surgical intervention.  The patient's history has been reviewed, patient examined, no change in status, stable for surgery.  I have reviewed the patient's chart and labs.  Questions were answered to the patient's satisfaction.     Kathryne Hitch

## 2022-09-06 NOTE — Op Note (Signed)
Operative Note  Date of operation: 09/06/2022 Preoperative diagnosis: Left hip avascular necrosis Postoperative diagnosis: Same  Procedure: Left direct anterior total hip arthroplasty  Implants: Implant Name Type Inv. Item Serial No. Manufacturer Lot No. LRB No. Used Action  CUP SECTOR GRIPTON 50MM - [id number] Cup CUP SECTOR GRIPTON 50MM  [employer] [id number] Left 1 Implanted  SCREW 6.5MMX25MM - [id number] Screw SCREW 6.5MMX25MM  [employer] [id number] Left 1 Implanted  PINNACLE PLUS 4 NEUTRAL - [id number] Hips PINNACLE PLUS 4 NEUTRAL  [employer] [id number] Left 1 Implanted  STEM FEM ACTIS HIGH SZ2 - [id number] Stem STEM FEM ACTIS HIGH SZ2  [employer] [id number] Left 1 Implanted  HEAD FEMORAL 32 CERAMIC - [id number] Hips HEAD FEMORAL 32 CERAMIC  [employer] [id number] Left 1 Implanted   Surgeon: Vanita Pandahristopher Y. Magnus IvanBlackman, MD Assistant: Rexene EdisonGil Clark, PA-C  Anesthesia: Spinal EBL: 150 cc Antibiotics: IV Ancef Complications: None  Indications: The patient is a 62 year old female with a long history of alcohol abuse.  She denies drinking now.  She has unfortunately developed severe avascular necrosis of both her hips.  X-rays show significant deformity both hips.  Her pain is daily and severe and she is having problems even mobilizing due to the deformity both hips and the severe pain she is experiencing.  At this point we have elected and recommended proceeding with a left total hip arthroplasty.  She understands that we are recommending this given the severity of her AVN combined with the severe pain she is experiencing.  We did talk about the risk of acute blood loss anemia, nerve or vessel injury, fracture, infection, DVT, dislocation, implant failure, fracture, leg length differences and wound healing issues.  She understands her goals are hopefully decrease pain, improve mobility and improve quality of life with the understanding that we will also need to proceed with a  right hip replacement in the near future.  Procedure description: After informed consent was obtained and the appropriate left hip was marked, the patient was brought to the operating room and sat up on the stretcher where spinal anesthesia was obtained.  She was laid in supine position on stretcher and a Foley catheter was placed.  Traction boots were placed on both her feet and she was placed supine on the Hana fracture table with a perineal post in place in both legs and inline skeletal traction devices no traction applied.  Both hips and pelvis were assessed radiographically.  The left hip was then prepped and draped in DuraPrep and sterile drapes.  A timeout was called and she identified as correct patient correct left hip.  An incision was then made just inferior and posterior to the ASIS and carried slightly bleak down the leg.  Dissection was carried down to the tensor fascia lata muscle and the tensor fascia was divided to proceed with a direct and approach the hip.  Circumflex vessels were identified and cauterized.  The hip capsule identified and opened up in L-type format finding a significant effusion.  A femoral neck cut was then made with oscillating saw just proximal to the lesser trochanter and completed with an osteotome and a corkscrew guide placed in the femoral head and the femoral head was removed in its entirety and that was completely and severely deformed.  We then removed remnants of the acetabular labrum and other debris from the hip and placed a bent Hohmann over the medial acetabular rim.  Reaming was then initiated under direct visualization and fluoroscopy from a 43 reamer  going up to a size 49 reamer.  The last reamer was definitely placed under direct fluoroscopy and visualization in order to obtain the depth of reaming, the inclination and anteversion.  We then placed the real DePuy Sectra GRIPTION acetabular opponent size 50 and a single screw.  We went with a 32+4 polythene  liner.  Attention was then turned in the femur.  With the left leg externally rotated to 120 degrees, extended and adducted a Mueller retractor was placed medially and a Hohmann retractor was placed behind the greater trochanter.  The joint capsule was laterally was released and a box cutting osteotome was used to enter the femoral canal.  Broaching was then initiated from a size 0 broach with the Actis broaching system going up to a size 2 broach.  We then trialed a standard offset femoral neck and a 32+1 trial hip ball and then the hip was reduced into the acetabulum..  She did feel stable and we increased her leg lengths but we felt like we needed more offset.  The trial components were then removed and we placed the real Actis femoral component size 2 with high offset and the real 32+1 ceramic hip bone again reduces in acetabulum and replaced with stability.  We then irrigated the soft tissue normal saline solution.  The joint capsule was closed with interrupted #1 Ethibond suture followed by #1 Vicryl to close the tensor fascia.  0 Vicryl used to close the deep tissue and 2-0 Vicryl used to close subcutaneous tissue.  The skin was closed with staples.  An Aquacel dressing was applied.  The patient was taken off of the Hana table and taken recovery in stable addition.  Rexene Edison, PA-C did assist during the entire case and beginning to end and his assistance was medically necessary and crucial for soft tissue retraction and management, helping guide implant placement and a layered closure of the wound.

## 2022-09-06 NOTE — Anesthesia Preprocedure Evaluation (Addendum)
Anesthesia Evaluation  Patient identified by MRN, date of birth, ID band Patient awake    Reviewed: Allergy & Precautions, NPO status , Patient's Chart, lab work & pertinent test results  Airway Mallampati: II  TM Distance: >3 FB Neck ROM: Full    Dental  (+) Poor Dentition, Dental Advisory Given, Missing, Partial Upper,    Pulmonary neg pulmonary ROS   Pulmonary exam normal breath sounds clear to auscultation       Cardiovascular negative cardio ROS Normal cardiovascular exam Rhythm:Regular Rate:Normal     Neuro/Psych negative neurological ROS  negative psych ROS   GI/Hepatic negative GI ROS,,,(+)     substance abuse  alcohol useMildly elevated LFT's   Endo/Other  negative endocrine ROS    Renal/GU negative Renal ROS  negative genitourinary   Musculoskeletal  (+) Arthritis , Osteoarthritis,  AVN left hip   Abdominal   Peds  Hematology Thrombocytopenia- Plt 119k   Anesthesia Other Findings   Reproductive/Obstetrics                              Anesthesia Physical Anesthesia Plan  ASA: 3  Anesthesia Plan: Spinal   Post-op Pain Management: Minimal or no pain anticipated   Induction: Intravenous  PONV Risk Score and Plan: 3 and Propofol infusion and Treatment may vary due to age or medical condition  Airway Management Planned: Natural Airway and Simple Face Mask  Additional Equipment: None  Intra-op Plan:   Post-operative Plan:   Informed Consent: I have reviewed the patients History and Physical, chart, labs and discussed the procedure including the risks, benefits and alternatives for the proposed anesthesia with the patient or authorized representative who has indicated his/her understanding and acceptance.     Dental advisory given  Plan Discussed with: Anesthesiologist and CRNA  Anesthesia Plan Comments:          Anesthesia Quick Evaluation

## 2022-09-06 NOTE — Transfer of Care (Signed)
Immediate Anesthesia Transfer of Care Note  Patient: Allison Murray  Procedure(s) Performed: LEFT TOTAL HIP ARTHROPLASTY ANTERIOR APPROACH (Left: Hip)  Patient Location: PACU  Anesthesia Type:Spinal  Level of Consciousness: drowsy  Airway & Oxygen Therapy: Patient Spontanous Breathing and Patient connected to face mask oxygen  Post-op Assessment: Report given to RN and Post -op Vital signs reviewed and stable  Post vital signs: Reviewed and stable  Last Vitals:  Vitals Value Taken Time  BP    Temp    Pulse 52 09/06/22 1132  Resp 15 09/06/22 1132  SpO2 94 % 09/06/22 1132  Vitals shown include unvalidated device data.  Last Pain:  Vitals:   09/06/22 0913  TempSrc:   PainSc: 0-No pain         Complications: No notable events documented.

## 2022-09-06 NOTE — Anesthesia Procedure Notes (Signed)
Spinal  Patient location during procedure: OR Start time: 09/06/2022 9:57 AM End time: 09/06/2022 10:01 AM Reason for block: surgical anesthesia Staffing Performed: resident/CRNA  Resident/CRNA: Lovie Chol, CRNA Performed by: Lovie Chol, CRNA Authorized by: Mal Amabile, MD   Preanesthetic Checklist Completed: patient identified, IV checked, site marked, risks and benefits discussed, surgical consent, monitors and equipment checked, pre-op evaluation and timeout performed Spinal Block Patient position: sitting Prep: DuraPrep Patient monitoring: heart rate, cardiac monitor, continuous pulse ox and blood pressure Approach: midline Location: L3-4 Injection technique: single-shot Needle Needle type: Pencan  Needle gauge: 24 G Needle length: 10 cm Assessment Sensory level: T6 Events: CSF return Additional Notes IV functioning, monitors applied to pt. Expiration date of kit checked and confirmed to be in date.  Sterile prep and drape, including hand hygiene, mask and sterile gloves were used. The patient was positioned and the spine was prepped. Skin was anesthetized with lidocaine. Free flow of clear CSF obtained prior to injecting local anesthetic into the CSF. Spinal needle aspirated freely following injection. Needle was carefully withdrawn, and pt tolerated procedure well. Loss of motor and sensory on exam post injection.

## 2022-09-06 NOTE — TOC Initial Note (Signed)
Transition of Care [facility]) - Initial/Assessment Note    Patient Details  Name: Allison Murray MRN: [id number] Date of Birth: 01-23-1961  [facility] CM/SW Contact:    Amada Jupiter, LCSW Phone Number: 09/06/2022, 2:18 PM  Clinical Narrative:    Met with pt and friend today to review potential dc needs.  Pt confirms that she is currently uninsured and without a PCP.  She states that she is working with [employer] DSS on [place] application and hopes this may come through in "a few weeks".  Explained to her that I am unable to secure any HH coverage for her and discussed plan to follow HEP and she is very agreeable - MD aware of this as well.  Pt does need a RW for home but she prefers rollator - will ask PT to determine if rollator is safe for pt to use and weekend TOC to order whatever PT has recommended.   Have reviewed program, [facility] and provided handout - pt can contact this agency to determine if she qualifies for PCP/ medication/ dental assistance through the program.  Info, also, placed on AVS.  Continue to follow for DME ordering.                 Expected Discharge Plan: Home/Self Care Barriers to Discharge: No Barriers Identified, No Home Care Agency will accept this patient   Patient Goals and CMS Choice Patient states their goals for this hospitalization and ongoing recovery are:: return home          Expected Discharge Plan and Services In-house Referral: Clinical Social Work     Living arrangements for the past 2 months: Single Family Home                                      Prior Living Arrangements/Services Living arrangements for the past 2 months: Single Family Home Lives with:: Self Patient language and need for interpreter reviewed:: Yes Do you feel safe going back to the place where you live?: Yes      Need for Family Participation in Patient Care: No (Comment) Care giver support system in place?: Yes  (comment)   Criminal Activity/Legal Involvement Pertinent to Current Situation/Hospitalization: No - Comment as needed  Activities of Daily Living Home Assistive Devices/Equipment: Crutches, Dentures (specify type) (partial on top) ADL Screening (condition at time of admission) Patient's cognitive ability adequate to safely complete daily activities?: Yes Is the patient deaf or have difficulty hearing?: No Does the patient have difficulty seeing, even when wearing glasses/contacts?: No Does the patient have difficulty concentrating, remembering, or making decisions?: No Patient able to express need for assistance with ADLs?: Yes Does the patient have difficulty dressing or bathing?: No Independently performs ADLs?: Yes (appropriate for developmental age) Does the patient have difficulty walking or climbing stairs?: Yes Weakness of Legs: Left Weakness of Arms/Hands: None  Permission Sought/Granted                  Emotional Assessment Appearance:: Appears stated age Attitude/Demeanor/Rapport: Gracious Affect (typically observed): Accepting, Pleasant Orientation: : Oriented to Self, Oriented to Place, Oriented to  Time, Oriented to Situation Alcohol / Substance Use: Not Applicable Psych Involvement: No (comment)  Admission diagnosis:  Status post total replacement of left hip [Z96.642] Patient Active Problem List   Diagnosis Date Noted   Status post total replacement of  left hip 09/06/2022   Avascular necrosis of bone of left hip 09/05/2022   Avascular necrosis of bone of right hip 07/31/2022   PCP:  Patient, No Pcp Per Pharmacy:   [id number] [place], [place] - [address] [place] [id number] Phone: [phone] Fax: [phone]     Social Determinants of Health (SDOH) Social History: SDOH Screenings   Food Insecurity: No Food Insecurity (09/06/2022)  Housing: Low Risk  (09/06/2022)  Transportation Needs: No Transportation  Needs (09/06/2022)  Utilities: Not At Risk (09/06/2022)  Tobacco Use: Low Risk  (09/06/2022)   SDOH Interventions: Food Insecurity Interventions: Intervention Not Indicated Housing Interventions: Intervention Not Indicated Transportation Interventions: Intervention Not Indicated Utilities Interventions: Intervention Not Indicated   Readmission Risk Interventions     No data to display

## 2022-09-06 NOTE — Anesthesia Procedure Notes (Signed)
Procedure Name: MAC Date/Time: 09/06/2022 10:08 AM  Performed by: Lovie Chol, CRNAPre-anesthesia Checklist: Patient identified, Emergency Drugs available, Suction available and Patient being monitored Oxygen Delivery Method: Simple face mask
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
## 2022-09-07 DIAGNOSIS — M87852 Other osteonecrosis, left femur: Secondary | ICD-10-CM | POA: Diagnosis not present

## 2022-09-07 LAB — BASIC METABOLIC PANEL
Anion gap: 5 (ref 5–15)
BUN: 16 mg/dL (ref 6–20)
CO2: 25 mmol/L (ref 22–32)
Calcium: 8.5 mg/dL — ABNORMAL LOW (ref 8.9–10.3)
Chloride: 107 mmol/L (ref 98–111)
Creatinine, Ser: 0.69 mg/dL (ref 0.44–1.00)
GFR, Estimated: >60 mL/min (ref >60)
Glucose, Bld: 169 mg/dL — ABNORMAL HIGH (ref 70–99)
Potassium: 3.6 mmol/L (ref 3.5–5.1)
Sodium: 137 mmol/L (ref 135–145)

## 2022-09-07 LAB — CBC
HCT: 24.8 % — ABNORMAL LOW (ref 36.0–46.0)
Hemoglobin: 8.6 g/dL — ABNORMAL LOW (ref 12.0–15.0)
MCH: 33.2 pg (ref 26.0–34.0)
MCHC: 34.7 g/dL (ref 30.0–36.0)
MCV: 95.8 fL (ref 80.0–100.0)
Platelets: 103 10*3/uL — ABNORMAL LOW (ref 150–400)
RBC: 2.59 MIL/uL — ABNORMAL LOW (ref 3.87–5.11)
RDW: 13.8 % (ref 11.5–15.5)
WBC: 9.9 10*3/uL (ref 4.0–10.5)
nRBC: 0 % (ref 0.0–0.2)

## 2022-09-07 MED ORDER — OXYCODONE HCL 5 MG PO TABS: 5.0000 mg | ORAL_TABLET | Freq: Four times a day (QID) | ORAL | 0 refills | Status: DC | PRN

## 2022-09-07 MED ORDER — ASPIRIN 81 MG PO CHEW: 81.0000 mg | CHEWABLE_TABLET | Freq: Two times a day (BID) | ORAL | 0 refills | Status: DC

## 2022-09-07 MED ORDER — METHOCARBAMOL 500 MG PO TABS: 500.0000 mg | ORAL_TABLET | Freq: Four times a day (QID) | ORAL | 1 refills | Status: DC | PRN

## 2022-09-07 NOTE — Plan of Care (Signed)
  Problem: Coping: Goal: Level of anxiety will decrease Outcome: Progressing   Problem: Pain Managment: Goal: General experience of comfort will improve Outcome: Progressing   

## 2022-09-07 NOTE — Discharge Instructions (Signed)

## 2022-09-07 NOTE — Discharge Summary (Signed)
Patient ID: Allison Murray MRN: [id number] DOB/AGE: 61/18/1962 61 y.o.  Admit date: 09/06/2022 Discharge date: 09/07/2022  Admission Diagnoses:  Principal Problem:   Avascular necrosis of bone of left hip Active Problems:   Status post total replacement of left hip   Discharge Diagnoses:  Same  Past Medical History:  Diagnosis Date   Arthritis    hips   ETOH abuse    history of. only a few beers a wekk now    Surgeries: Procedure(s): LEFT TOTAL HIP ARTHROPLASTY ANTERIOR APPROACH on 09/06/2022   Consultants:   Discharged Condition: Improved  Hospital Course: Allison FentonWanda K Ambroise is an 62 y.o. female who was admitted 09/06/2022 for operative treatment ofAvascular necrosis of bone of left hip. Patient has severe unremitting pain that affects sleep, daily activities, and work/hobbies. After pre-op clearance the patient was taken to the operating room on 09/06/2022 and underwent  Procedure(s): LEFT TOTAL HIP ARTHROPLASTY ANTERIOR APPROACH.    Patient was given perioperative antibiotics:  Anti-infectives (From admission, onward)    Start     Dose/Rate Route Frequency Ordered Stop   09/06/22 1600  ceFAZolin (ANCEF) IVPB 1 g/50 mL premix        1 g 100 mL/hr over 30 Minutes Intravenous Every 6 hours 09/06/22 1257 09/06/22 2151   09/06/22 0815  ceFAZolin (ANCEF) IVPB 2g/100 mL premix        2 g 200 mL/hr over 30 Minutes Intravenous On call to O.R. 09/06/22 0806 09/06/22 1033        Patient was given sequential compression devices, early ambulation, and chemoprophylaxis to prevent DVT.  Patient benefited maximally from hospital stay and there were no complications.    Recent vital signs: Patient Vitals for the past 24 hrs:  BP Temp Temp src Pulse Resp SpO2  09/07/22 0934 (!) 110/59 98.3 F (36.8 C) Oral 69 18 97 %  09/07/22 0534 110/63 98.1 F (36.7 C) Oral 64 18 96 %  09/07/22 0127 113/64 97.6 F (36.4 C) Oral 65 18 95 %  09/06/22 2026 125/70 98.4 F (36.9 C) Oral 66 18 99 %   09/06/22 1450 110/78 97.7 F (36.5 C) Oral 66 16 100 %  09/06/22 1300 (!) 92/58 97.6 F (36.4 C) Oral (!) 57 14 98 %  09/06/22 1245 (!) 98/58 -- -- (!) 56 14 93 %  09/06/22 1230 (!) 95/50 (!) 97.5 F (36.4 C) -- (!) 57 13 99 %  09/06/22 1215 (!) 98/56 -- -- (!) 55 14 97 %  09/06/22 1200 (!) 99/59 -- -- (!) 57 12 98 %  09/06/22 1145 (!) 90/46 -- -- 70 19 95 %  09/06/22 1130 (!) 92/52 (!) 97.4 F (36.3 C) -- (!) 55 12 98 %     Recent laboratory studies:  Recent Labs    09/06/22 0850 09/07/22 0342  WBC 5.5 9.9  HGB 12.2 8.6*  HCT 35.4* 24.8*  PLT 117* 103*  NA  --  137  K  --  3.6  CL  --  107  CO2  --  25  BUN  --  16  CREATININE  --  0.69  GLUCOSE  --  169*  CALCIUM  --  8.5*     Discharge Medications:   Allergies as of 09/07/2022   No Known Allergies      Medication List     TAKE these medications    aspirin 81 MG chewable tablet Chew 1 tablet (81 mg total) by mouth 2 (two) times  daily.   methocarbamol 500 MG tablet Commonly known as: ROBAXIN Take 1 tablet (500 mg total) by mouth every 6 (six) hours as needed for muscle spasms.   naproxen sodium 220 MG tablet Commonly known as: ALEVE Take 220 mg by mouth 3 (three) times daily as needed (pain).   oxyCODONE 5 MG immediate release tablet Commonly known as: Oxy IR/ROXICODONE Take 1-2 tablets (5-10 mg total) by mouth every 6 (six) hours as needed for moderate pain (pain score 4-6).               Durable Medical Equipment  (From admission, onward)           Start     Ordered   09/06/22 1258  DME 3 n 1  Once        09/06/22 1257   09/06/22 1258  DME Walker rolling  Once       Question Answer Comment  Walker: With 5 Inch Wheels   Patient needs a walker to treat with the following condition Status post total replacement of left hip      09/06/22 1257            Diagnostic Studies: DG Pelvis Portable  Result Date: 09/06/2022 CLINICAL DATA:  Status post total left hip arthroplasty.  EXAM: PORTABLE PELVIS 1-2 VIEWS COMPARISON:  Pelvis and left hip radiographs 07/04/2022 FINDINGS: There is diffuse decreased bone mineralization. Interval total left hip arthroplasty. No perihardware lucency is seen to indicate hardware failure or loosening. Severe superior right femoroacetabular joint space narrowing with bone-on-bone contact, mild-to-moderate superior femoral head and acetabular cortical erosion, and high-grade superior femoral head subchondral degenerative cystic changes, similar to prior. The bilateral sacroiliac joint spaces are maintained. Mild superior pubic symphysis joint space narrowing. Expected postoperative changes of subcutaneous air about the left hip and lateral left hip surgical skin staples. IMPRESSION: 1. Interval total left hip arthroplasty without evidence of hardware failure. 2. Severe right femoroacetabular osteoarthritis. Electronically Signed   By: Neita Garnet M.D.   On: 09/06/2022 12:11   DG HIP UNILAT WITH PELVIS 1V LEFT  Result Date: 09/06/2022 CLINICAL DATA:  Total left hip arthroplasty. Intraoperative fluoroscopy. EXAM: DG HIP (WITH OR WITHOUT PELVIS) 1V*L* COMPARISON:  Pelvis and left hip radiographs 07/04/2022 FINDINGS: Images were performed intraoperatively without the presence of a radiologist. Interval total left hip arthroplasty. No hardware complication is seen. Severe right femoroacetabular osteoarthritis with fall down contact and moderate bone erosion superiorly, unchanged. Total fluoroscopy images: 3 Total fluoroscopy time: 15 seconds Total dose: Radiation Exposure Index (as provided by the fluoroscopic device): 1.59 mGy air Kerma Please see intraoperative findings for further detail. IMPRESSION: 1. Intraoperative fluoroscopy for total left hip arthroplasty. 2. Severe right femoroacetabular osteoarthritis. Electronically Signed   By: Neita Garnet M.D.   On: 09/06/2022 11:19   DG C-Arm 1-60 Min-No Report  Result Date: 09/06/2022 Fluoroscopy was  utilized by the requesting physician.  No radiographic interpretation.    Disposition: Discharge disposition: 01-Home or Self Care          Follow-up Information     [facility] Call.   Why: call to set up primary care doctor and other services as available Contact information: [address]   [place], [place]  [address]  [phone]        Kathryne Hitch, MD Follow up in 2 week(s).   Specialty: Orthopedic Surgery Contact information: [address] [place] [address] [phone]  Signed: Kathryne Hitch 09/07/2022, 10:20 AM

## 2022-09-07 NOTE — Progress Notes (Signed)
Subjective: 1 Day Post-Op Procedure(s) (LRB): LEFT TOTAL HIP ARTHROPLASTY ANTERIOR APPROACH (Left) Patient reports pain as moderate.    Objective: Vital signs in last 24 hours: Temp:  [97.4 F (36.3 C)-98.4 F (36.9 C)] 98.3 F (36.8 C) (04/06 0934) Pulse Rate:  [55-70] 69 (04/06 0934) Resp:  [12-19] 18 (04/06 0934) BP: (90-125)/(46-78) 110/59 (04/06 0934) SpO2:  [93 %-100 %] 97 % (04/06 0934)  Intake/Output from previous day: 04/05 0701 - 04/06 0700 In: 3022.1 [P.O.:685; I.V.:2114.8; IV Piggyback:222.3] Out: 1800 [Urine:1650; Blood:150] Intake/Output this shift: No intake/output data recorded.  Recent Labs    09/06/22 0850 09/07/22 0342  HGB 12.2 8.6*   Recent Labs    09/06/22 0850 09/07/22 0342  WBC 5.5 9.9  RBC 3.77* 2.59*  HCT 35.4* 24.8*  PLT 117* 103*   Recent Labs    09/07/22 0342  NA 137  K 3.6  CL 107  CO2 25  BUN 16  CREATININE 0.69  GLUCOSE 169*  CALCIUM 8.5*   No results for input(s): "LABPT", "INR" in the last 72 hours.  Sensation intact distally Intact pulses distally Dorsiflexion/Plantar flexion intact Incision: dressing C/D/I   Assessment/Plan: 1 Day Post-Op Procedure(s) (LRB): LEFT TOTAL HIP ARTHROPLASTY ANTERIOR APPROACH (Left) Up with therapy Discharge to home this afternoon.     Kathryne Hitch 09/07/2022, 10:18 AM
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
## 2022-09-09 ENCOUNTER — Encounter (HOSPITAL_COMMUNITY): Payer: Self-pay | Admitting: Orthopaedic Surgery

## 2022-09-19 ENCOUNTER — Ambulatory Visit: Payer: Medicaid Other | Admitting: Orthopaedic Surgery

## 2022-09-19 ENCOUNTER — Encounter: Payer: Self-pay | Admitting: Orthopaedic Surgery

## 2022-09-19 DIAGNOSIS — Z96642 Presence of left artificial hip joint: Secondary | ICD-10-CM

## 2022-09-19 NOTE — Progress Notes (Signed)
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  She says she has done well overall.  She was confused about taking baby aspirin twice a day and never took it.  She does report foot and ankle swelling but denies any calf pain.  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  She uses the walker sparingly.  Her left hip incision looks good.  The staples have been removed and Steri-Strips applied.  Her calf is soft.  There is foot and ankle swelling.  She does have TED hose at home so we will have her wear these till the swelling subsides.  Will see her back in 4 weeks to see how she is doing overall and certainly can talk about her right hip with replacing that secondary to AVN at some point.  No x-rays are needed at her next visit.

## 2022-10-21 ENCOUNTER — Encounter: Payer: Self-pay | Admitting: Orthopaedic Surgery

## 2022-10-21 ENCOUNTER — Ambulatory Visit (INDEPENDENT_AMBULATORY_CARE_PROVIDER_SITE_OTHER): Payer: Medicaid Other | Admitting: Orthopaedic Surgery

## 2022-10-21 DIAGNOSIS — M87051 Idiopathic aseptic necrosis of right femur: Secondary | ICD-10-CM | POA: Diagnosis not present

## 2022-10-21 DIAGNOSIS — Z96642 Presence of left artificial hip joint: Secondary | ICD-10-CM

## 2022-10-21 NOTE — Progress Notes (Signed)
The patient is a 62 year old female well-known to [place].  She has bilateral hip avascular necrosis and we were able to successfully replace her left hip on April 5.  She has done well with that hip replacement.  Her right hip is bothering her and has known well-documented AVN.  She is interested in proceeding with a right hip replacement later this summer.  Her left hip is doing well she states.  On exam her left hip moves smoothly and fluidly with no issues at all.  Incisions healed nicely.  There are some swelling to be expected.  There is a little bit of foot and ankle swelling as well.  I talked her about wearing TED hose and to expect swelling intermittently.  Her calf is soft.  Right hip has significant pain with internal and external rotation with known AVN on previous imaging studies.  At this point we will work on getting her scheduled for right total hip arthroplasty later this summer.  All questions concerns were answered and addressed.  Will be in touch with scheduling her for her right hip replacement.

## 2022-12-16 NOTE — Patient Instructions (Addendum)
SURGICAL WAITING ROOM VISITATION Patients having surgery or a procedure may have no more than 2 support people in the waiting area - these visitors may rotate.    Children under the age of 104 must have an adult with them who is not the patient.  If the patient needs to stay at the hospital during part of their recovery, the visitor guidelines for inpatient rooms apply. Pre-op nurse will coordinate an appropriate time for 1 support person to accompany patient in pre-op.  This support person may not rotate.    Please refer to the [facility] website for the visitor guidelines for Inpatients (after your surgery is over and you are in a regular room).       Your procedure is scheduled on: 12-27-22   Report to  Main Entrance    Report to admitting at 6:15 AM   Call this number if you have problems the morning of surgery [phone]   Do not eat food :After Midnight.   After Midnight you may have the following liquids until 5:45 AM DAY OF SURGERY  Water Non-Citrus Juices (without pulp, NO RED-Apple, White grape, White cranberry) Black Coffee (NO MILK/CREAM OR CREAMERS, sugar ok)  Clear Tea (NO MILK/CREAM OR CREAMERS, sugar ok) regular and decaf                             Plain Jell-O (NO RED)                                           Fruit ices (not with fruit pulp, NO RED)                                     Popsicles (NO RED)                                                               Sports drinks like Gatorade (NO RED)                   The day of surgery:  Drink ONE (1) Pre-Surgery Clear Ensure at 5:45 AM the morning of surgery. Drink in one sitting. Do not sip.  This drink was given to you during your hospital  pre-op appointment visit. Nothing else to drink after completing the Pre-Surgery Clear Ensure.          If you have questions, please contact your surgeon's office.   FOLLOW  ANY ADDITIONAL PRE OP INSTRUCTIONS YOU RECEIVED FROM YOUR SURGEON'S  OFFICE!!!     Oral Hygiene is also important to reduce your risk of infection.                                    Remember - BRUSH YOUR TEETH THE MORNING OF SURGERY WITH YOUR REGULAR TOOTHPASTE   Do NOT smoke after Midnight   Take these medicines the morning of surgery with A SIP OF WATER:   Oxycodone if needed  Bring  CPAP mask and tubing day of surgery.                              You may not have any metal on your body including hair pins, jewelry, and body piercing             Do not wear make-up, lotions, powders, perfumes or deodorant  Do not wear nail polish including gel and S&S, artificial/acrylic nails, or any other type of covering on natural nails including finger and toenails. If you have artificial nails, gel coating, etc. that needs to be removed by a nail salon please have this removed prior to surgery or surgery may need to be canceled/ delayed if the surgeon/ anesthesia feels like they are unable to be safely monitored.   Do not shave  5 days prior to surgery.    Do not bring valuables to the hospital. [facility] IS NOT RESPONSIBLE   FOR VALUABLES.   Contacts, dentures or bridgework may not be worn into surgery.   Bring small overnight bag day of surgery.   DO NOT BRING YOUR HOME MEDICATIONS TO THE HOSPITAL. PHARMACY WILL DISPENSE MEDICATIONS LISTED ON YOUR MEDICATION LIST TO YOU DURING YOUR ADMISSION IN THE HOSPITAL!   Special Instructions: Bring a copy of your healthcare power of attorney and living will documents the day of surgery if you haven't scanned them before.              Please read over the following fact sheets you were given: IF YOU HAVE QUESTIONS ABOUT YOUR PRE-OP INSTRUCTIONS PLEASE CALL [phone] [facility]  If you received a COVID test during your pre-op visit  it is requested that you wear a mask when out in public, stay away from anyone that may not be feeling well and notify your surgeon if you develop symptoms. If you test positive for Covid or  have been in contact with anyone that has tested positive in the last 10 days please notify you surgeon.    Pre-operative 5 CHG Bath Instructions   You can play a key role in reducing the risk of infection after surgery. Your skin needs to be as free of germs as possible. You can reduce the number of germs on your skin by washing with CHG (chlorhexidine gluconate) soap before surgery. CHG is an antiseptic soap that kills germs and continues to kill germs even after washing.   DO NOT use if you have an allergy to chlorhexidine/CHG or antibacterial soaps. If your skin becomes reddened or irritated, stop using the CHG and notify one of our RNs at  [phone] .   Please shower with the CHG soap starting 4 days before surgery using the following schedule:     Please keep in mind the following:  DO NOT shave, including legs and underarms, starting the day of your first shower.   You may shave your face at any point before/day of surgery.  Place clean sheets on your bed the day you start using CHG soap. Use a clean washcloth (not used since being washed) for each shower. DO NOT sleep with pets once you start using the CHG.   CHG Shower Instructions:  If you choose to wash your hair and private area, wash first with your normal shampoo/soap.  After you use shampoo/soap, rinse your hair and body thoroughly to remove shampoo/soap residue.  Turn the water OFF and apply about 3 tablespoons (45 ml)  of CHG soap to a CLEAN washcloth.  Apply CHG soap ONLY FROM YOUR NECK DOWN TO YOUR TOES (washing for 3-5 minutes)  DO NOT use CHG soap on face, private areas, open wounds, or sores.  Pay special attention to the area where your surgery is being performed.  If you are having back surgery, having someone wash your back for you may be helpful. Wait 2 minutes after CHG soap is applied, then you may rinse off the CHG soap.  Pat dry with a clean towel  Put on clean clothes/pajamas   If you choose to wear  lotion, please use ONLY the CHG-compatible lotions on the back of this paper.     Additional instructions for the day of surgery: DO NOT APPLY any lotions, deodorants, cologne, or perfumes.   Put on clean/comfortable clothes.  Brush your teeth.  Ask your nurse before applying any prescription medications to the skin.      CHG Compatible Lotions   Aveeno Moisturizing lotion  Cetaphil Moisturizing Cream  Cetaphil Moisturizing Lotion  Clairol Herbal Essence Moisturizing Lotion, Dry Skin  Clairol Herbal Essence Moisturizing Lotion, Extra Dry Skin  Clairol Herbal Essence Moisturizing Lotion, Normal Skin  Curel Age Defying Therapeutic Moisturizing Lotion with Alpha Hydroxy  Curel Extreme Care Body Lotion  Curel Soothing Hands Moisturizing Hand Lotion  Curel Therapeutic Moisturizing Cream, Fragrance-Free  Curel Therapeutic Moisturizing Lotion, Fragrance-Free  Curel Therapeutic Moisturizing Lotion, Original Formula  Eucerin Daily Replenishing Lotion  Eucerin Dry Skin Therapy Plus Alpha Hydroxy Crme  Eucerin Dry Skin Therapy Plus Alpha Hydroxy Lotion  Eucerin Original Crme  Eucerin Original Lotion  Eucerin Plus Crme Eucerin Plus Lotion  Eucerin TriLipid Replenishing Lotion  Keri Anti-Bacterial Hand Lotion  Keri Deep Conditioning Original Lotion Dry Skin Formula Softly Scented  Keri Deep Conditioning Original Lotion, Fragrance Free Sensitive Skin Formula  Keri Lotion Fast Absorbing Fragrance Free Sensitive Skin Formula  Keri Lotion Fast Absorbing Softly Scented Dry Skin Formula  Keri Original Lotion  Keri Skin Renewal Lotion Keri Silky Smooth Lotion  Keri Silky Smooth Sensitive Skin Lotion  Nivea Body Creamy Conditioning Oil  Nivea Body Extra Enriched Lotion  Nivea Body Original Lotion  Nivea Body Sheer Moisturizing Lotion Nivea Crme  Nivea Skin Firming Lotion  NutraDerm 30 Skin Lotion  NutraDerm Skin Lotion  NutraDerm Therapeutic Skin Cream  NutraDerm Therapeutic Skin  Lotion  ProShield Protective Hand Cream  Provon moisturizing lotion   PATIENT SIGNATURE_________________________________  NURSE SIGNATURE__________________________________  ________________________________________________________________________    Allison Murray  An incentive spirometer is a tool that can help keep your lungs clear and active. This tool measures how well you are filling your lungs with each breath. Taking long deep breaths may help reverse or decrease the chance of developing breathing (pulmonary) problems (especially infection) following: A long period of time when you are unable to move or be active. BEFORE THE PROCEDURE  If the spirometer includes an indicator to show your best effort, your nurse or respiratory therapist will set it to a desired goal. If possible, sit up straight or lean slightly forward. Try not to slouch. Hold the incentive spirometer in an upright position. INSTRUCTIONS FOR USE  Sit on the edge of your bed if possible, or sit up as far as you can in bed or on a chair. Hold the incentive spirometer in an upright position. Breathe out normally. Place the mouthpiece in your mouth and seal your lips tightly around it. Breathe in slowly and as deeply as possible, raising the  piston or the ball toward the top of the column. Hold your breath for 3-5 seconds or for as long as possible. Allow the piston or ball to fall to the bottom of the column. Remove the mouthpiece from your mouth and breathe out normally. Rest for a few seconds and repeat Steps 1 through 7 at least 10 times every 1-2 hours when you are awake. Take your time and take a few normal breaths between deep breaths. The spirometer may include an indicator to show your best effort. Use the indicator as a goal to work toward during each repetition. After each set of 10 deep breaths, practice coughing to be sure your lungs are clear. If you have an incision (the cut made at the time of  surgery), support your incision when coughing by placing a pillow or rolled up towels firmly against it. Once you are able to get out of bed, walk around indoors and cough well. You may stop using the incentive spirometer when instructed by your caregiver.  RISKS AND COMPLICATIONS Take your time so you do not get dizzy or light-headed. If you are in pain, you may need to take or ask for pain medication before doing incentive spirometry. It is harder to take a deep breath if you are having pain. AFTER USE Rest and breathe slowly and easily. It can be helpful to keep track of a log of your progress. Your caregiver can provide you with a simple table to help with this. If you are using the spirometer at home, follow these instructions: SEEK MEDICAL CARE IF:  You are having difficultly using the spirometer. You have trouble using the spirometer as often as instructed. Your pain medication is not giving enough relief while using the spirometer. You develop fever of 100.5 F (38.1 C) or higher. SEEK IMMEDIATE MEDICAL CARE IF:  You cough up bloody sputum that had not been present before. You develop fever of 102 F (38.9 C) or greater. You develop worsening pain at or near the incision site. MAKE SURE YOU:  Understand these instructions. Will watch your condition. Will get help right away if you are not doing well or get worse. Document Released: 09/30/2006 Document Revised: 08/12/2011 Document Reviewed: 12/01/2006 Exit[employer] Patient Information 2014 [employer], [place].   ________________________________________________________________________ WHAT IS A BLOOD TRANSFUSION? Blood Transfusion Information  A transfusion is the replacement of blood or some of its parts. Blood is made up of multiple cells which provide different functions. Red blood cells carry oxygen and are used for blood loss replacement. White blood cells fight against infection. Platelets control bleeding. Plasma helps clot  blood. Other blood products are available for specialized needs, such as hemophilia or other clotting disorders. BEFORE THE TRANSFUSION  Who gives blood for transfusions?  Healthy volunteers who are fully evaluated to make sure their blood is safe. This is blood bank blood. Transfusion therapy is the safest it has ever been in the practice of medicine. Before blood is taken from a donor, a complete history is taken to make sure that person has no history of diseases nor engages in risky social behavior (examples are intravenous drug use or sexual activity with multiple partners). The donor's travel history is screened to minimize risk of transmitting infections, such as malaria. The donated blood is tested for signs of infectious diseases, such as HIV and hepatitis. The blood is then tested to be sure it is compatible with you in order to minimize the chance of a transfusion reaction. If you  or a relative donates blood, this is often done in anticipation of surgery and is not appropriate for emergency situations. It takes many days to process the donated blood. RISKS AND COMPLICATIONS Although transfusion therapy is very safe and saves many lives, the main dangers of transfusion include:  Getting an infectious disease. Developing a transfusion reaction. This is an allergic reaction to something in the blood you were given. Every precaution is taken to prevent this. The decision to have a blood transfusion has been considered carefully by your caregiver before blood is given. Blood is not given unless the benefits outweigh the risks. AFTER THE TRANSFUSION Right after receiving a blood transfusion, you will usually feel much better and more energetic. This is especially true if your red blood cells have gotten low (anemic). The transfusion raises the level of the red blood cells which carry oxygen, and this usually causes an energy increase. The nurse administering the transfusion will monitor you  carefully for complications. HOME CARE INSTRUCTIONS  No special instructions are needed after a transfusion. You may find your energy is better. Speak with your caregiver about any limitations on activity for underlying diseases you may have. SEEK MEDICAL CARE IF:  Your condition is not improving after your transfusion. You develop redness or irritation at the intravenous (IV) site. SEEK IMMEDIATE MEDICAL CARE IF:  Any of the following symptoms occur over the next 12 hours: Shaking chills. You have a temperature by mouth above 102 F (38.9 C), not controlled by medicine. Chest, back, or muscle pain. People around you feel you are not acting correctly or are confused. Shortness of breath or difficulty breathing. Dizziness and fainting. You get a rash or develop hives. You have a decrease in urine output. Your urine turns a dark color or changes to pink, red, or brown. Any of the following symptoms occur over the next 10 days: You have a temperature by mouth above 102 F (38.9 C), not controlled by medicine. Shortness of breath. Weakness after normal activity. The white part of the eye turns yellow (jaundice). You have a decrease in the amount of urine or are urinating less often. Your urine turns a dark color or changes to pink, red, or brown. Document Released: 05/17/2000 Document Revised: 08/12/2011 Document Reviewed: 01/04/2008 [facility] Patient Information 2014 [place], [place].  _______________________________________________________________________

## 2022-12-19 ENCOUNTER — Other Ambulatory Visit: Payer: Self-pay

## 2022-12-19 ENCOUNTER — Encounter (HOSPITAL_COMMUNITY)
Admission: RE | Admit: 2022-12-19 | Discharge: 2022-12-19 | Disposition: A | Discharge status: Home / Self Care | Payer: Medicaid Other | Source: Ambulatory Visit | Attending: Orthopaedic Surgery | Admitting: Orthopaedic Surgery

## 2022-12-19 ENCOUNTER — Encounter (HOSPITAL_COMMUNITY): Payer: Self-pay

## 2022-12-19 VITALS — BP 140/91 | HR 69 | Temp 98.3°F | Resp 16 | Ht 62.0 in | Wt 121.0 lb

## 2022-12-19 DIAGNOSIS — Z01818 Encounter for other preprocedural examination: Secondary | ICD-10-CM

## 2022-12-19 DIAGNOSIS — M87051 Idiopathic aseptic necrosis of right femur: Secondary | ICD-10-CM | POA: Insufficient documentation

## 2022-12-19 DIAGNOSIS — Z01812 Encounter for preprocedural laboratory examination: Secondary | ICD-10-CM | POA: Diagnosis not present

## 2022-12-19 HISTORY — DX: Unspecified hearing loss, unspecified ear: H91.90

## 2022-12-19 LAB — TYPE AND SCREEN
ABO/RH(D): A POS
Antibody Screen: NEGATIVE

## 2022-12-19 LAB — CBC
HCT: 39.7 % (ref 36.0–46.0)
Hemoglobin: 13.4 g/dL (ref 12.0–15.0)
MCH: 31.5 pg (ref 26.0–34.0)
MCHC: 33.8 g/dL (ref 30.0–36.0)
MCV: 93.2 fL (ref 80.0–100.0)
Platelets: 132 10*3/uL — ABNORMAL LOW (ref 150–400)
RBC: 4.26 MIL/uL (ref 3.87–5.11)
RDW: 14.8 % (ref 11.5–15.5)
WBC: 6 10*3/uL (ref 4.0–10.5)
nRBC: 0 % (ref 0.0–0.2)

## 2022-12-19 LAB — COMPREHENSIVE METABOLIC PANEL
ALT: 94 U/L — ABNORMAL HIGH (ref 0–44)
AST: 120 U/L — ABNORMAL HIGH (ref 15–41)
Albumin: 3.6 g/dL (ref 3.5–5.0)
Alkaline Phosphatase: 115 U/L (ref 38–126)
Anion gap: 6 (ref 5–15)
BUN: 12 mg/dL (ref 8–23)
CO2: 25 mmol/L (ref 22–32)
Calcium: 9.3 mg/dL (ref 8.9–10.3)
Chloride: 105 mmol/L (ref 98–111)
Creatinine, Ser: 0.55 mg/dL (ref 0.44–1.00)
GFR, Estimated: >60 mL/min (ref >60)
Glucose, Bld: 105 mg/dL — ABNORMAL HIGH (ref 70–99)
Potassium: 4 mmol/L (ref 3.5–5.1)
Sodium: 136 mmol/L (ref 135–145)
Total Bilirubin: 1.3 mg/dL — ABNORMAL HIGH (ref 0.3–1.2)
Total Protein: 7.8 g/dL (ref 6.5–8.1)

## 2022-12-19 LAB — SURGICAL PCR SCREEN
MRSA, PCR: NEGATIVE
Staphylococcus aureus: NEGATIVE

## 2022-12-19 NOTE — Progress Notes (Addendum)
PCP -  Cardiologist -   PPM/ICD -  Device Orders -  Rep Notified -   Chest x-ray -  EKG -  Stress Test -  ECHO -  Cardiac Cath -   Sleep Study -  CPAP -   Fasting Blood Sugar -  Checks Blood Sugar _____ times a day  Blood Thinner Instructions: Aspirin Instructions:  ERAS Protcol - PRE-SURGERY Ensure or G2-   COVID TEST-  COVID vaccine -  Activity--Able to climb a flight of stairs withh no CP or SOB Anesthesia review: Alt 94, AST 120 , hx ETOH abuse  Patient denies shortness of breath, fever, cough and chest pain at PAT appointment   All instructions explained to the patient, with a verbal understanding of the material. Patient agrees to go over the instructions while at home for a better understanding. Patient also instructed to self quarantine after being tested for COVID-19. The opportunity to ask questions was provided.

## 2022-12-23 ENCOUNTER — Other Ambulatory Visit: Payer: Self-pay

## 2022-12-23 ENCOUNTER — Encounter (HOSPITAL_COMMUNITY): Payer: Self-pay

## 2022-12-23 DIAGNOSIS — Z96641 Presence of right artificial hip joint: Secondary | ICD-10-CM
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
## 2022-12-26 NOTE — Anesthesia Preprocedure Evaluation (Addendum)
Anesthesia Evaluation  Patient identified by MRN, date of birth, ID band Patient awake    Reviewed: Allergy & Precautions, NPO status , Patient's Chart, lab work & pertinent test results  History of Anesthesia Complications Negative for: history of anesthetic complications  Airway Mallampati: II  TM Distance: >3 FB Neck ROM: Full    Dental  (+) Poor Dentition, Missing, Partial Upper, Dental Advisory Given,    Pulmonary neg pulmonary ROS   Pulmonary exam normal        Cardiovascular negative cardio ROS Normal cardiovascular exam     Neuro/Psych negative neurological ROS  negative psych ROS   GI/Hepatic negative GI ROS,,,(+)     substance abuse  alcohol useMildly elevated LFT's   Endo/Other  negative endocrine ROS    Renal/GU negative Renal ROS  negative genitourinary   Musculoskeletal  (+) Arthritis , Osteoarthritis,  AVN    Abdominal   Peds  Hematology Thrombocytopenia- Plt 119k   Anesthesia Other Findings   Reproductive/Obstetrics                              Anesthesia Physical Anesthesia Plan  ASA: 3  Anesthesia Plan: Spinal and MAC   Post-op Pain Management: Minimal or no pain anticipated   Induction: Intravenous  PONV Risk Score and Plan: 3 and Propofol infusion and Treatment may vary due to age or medical condition  Airway Management Planned: Natural Airway and Simple Face Mask  Additional Equipment: None  Intra-op Plan:   Post-operative Plan:   Informed Consent: I have reviewed the patients History and Physical, chart, labs and discussed the procedure including the risks, benefits and alternatives for the proposed anesthesia with the patient or authorized representative who has indicated his/her understanding and acceptance.     Dental advisory given  Plan Discussed with: Anesthesiologist and CRNA  Anesthesia Plan Comments:          Anesthesia Quick  Evaluation

## 2022-12-27 ENCOUNTER — Ambulatory Visit (HOSPITAL_COMMUNITY): Payer: Medicaid Other | Admitting: Anesthesiology

## 2022-12-27 ENCOUNTER — Other Ambulatory Visit: Payer: Self-pay

## 2022-12-27 ENCOUNTER — Ambulatory Visit (HOSPITAL_COMMUNITY): Payer: Medicaid Other | Admitting: Physician Assistant

## 2022-12-27 ENCOUNTER — Ambulatory Visit (HOSPITAL_COMMUNITY): Payer: Medicaid Other

## 2022-12-27 ENCOUNTER — Observation Stay (HOSPITAL_COMMUNITY): Payer: Medicaid Other

## 2022-12-27 ENCOUNTER — Encounter (HOSPITAL_COMMUNITY)
Admission: AD | Disposition: A | Discharge status: Home / Self Care | Payer: Self-pay | Source: Home / Self Care | Attending: Orthopaedic Surgery

## 2022-12-27 ENCOUNTER — Inpatient Hospital Stay (HOSPITAL_COMMUNITY)
Admission: AD | Admit: 2022-12-27 | Discharge: 2022-12-29 | DRG: 470 | Disposition: A | Discharge status: Home / Self Care | Payer: Medicaid Other | Attending: Orthopaedic Surgery | Admitting: Orthopaedic Surgery

## 2022-12-27 ENCOUNTER — Encounter (HOSPITAL_COMMUNITY): Payer: Self-pay | Admitting: Orthopaedic Surgery

## 2022-12-27 DIAGNOSIS — Z79899 Other long term (current) drug therapy: Secondary | ICD-10-CM

## 2022-12-27 DIAGNOSIS — M87851 Other osteonecrosis, right femur: Secondary | ICD-10-CM | POA: Diagnosis not present

## 2022-12-27 DIAGNOSIS — M87051 Idiopathic aseptic necrosis of right femur: Secondary | ICD-10-CM | POA: Diagnosis not present

## 2022-12-27 DIAGNOSIS — Z96642 Presence of left artificial hip joint: Secondary | ICD-10-CM | POA: Diagnosis present

## 2022-12-27 DIAGNOSIS — D696 Thrombocytopenia, unspecified: Secondary | ICD-10-CM | POA: Diagnosis not present

## 2022-12-27 DIAGNOSIS — Z96641 Presence of right artificial hip joint: Secondary | ICD-10-CM

## 2022-12-27 DIAGNOSIS — Z7982 Long term (current) use of aspirin: Secondary | ICD-10-CM

## 2022-12-27 DIAGNOSIS — H9190 Unspecified hearing loss, unspecified ear: Secondary | ICD-10-CM | POA: Diagnosis present

## 2022-12-27 HISTORY — PX: TOTAL HIP ARTHROPLASTY: SHX124

## 2022-12-27 SURGERY — ARTHROPLASTY, HIP, TOTAL, ANTERIOR APPROACH
Anesthesia: Monitor Anesthesia Care | Site: Hip | Laterality: Right

## 2022-12-27 MED ORDER — STERILE WATER FOR IRRIGATION IR SOLN
Status: DC | PRN
  Administered 2022-12-27: 2000 mL

## 2022-12-27 MED ORDER — METOCLOPRAMIDE HCL 5 MG PO TABS: 5.0000 mg | ORAL_TABLET | Freq: Three times a day (TID) | ORAL | Status: DC | PRN

## 2022-12-27 MED ORDER — DOCUSATE SODIUM 100 MG PO CAPS
100.0000 mg | ORAL_CAPSULE | Freq: Two times a day (BID) | ORAL | Status: DC
  Administered 2022-12-27 – 2022-12-29 (×4): 100 mg via ORAL
  Filled 2022-12-27 (×4): qty 1

## 2022-12-27 MED ORDER — PHENYLEPHRINE HCL-NACL 20-0.9 MG/250ML-% IV SOLN
INTRAVENOUS | Status: DC | PRN
  Administered 2022-12-27: 50 ug/min via INTRAVENOUS

## 2022-12-27 MED ORDER — ONDANSETRON HCL 4 MG PO TABS
4.0000 mg | ORAL_TABLET | Freq: Four times a day (QID) | ORAL | Status: DC | PRN
  Administered 2022-12-29: 4 mg via ORAL
  Filled 2022-12-27: qty 1

## 2022-12-27 MED ORDER — MIDAZOLAM HCL 2 MG/2ML IJ SOLN
INTRAMUSCULAR | Status: AC
  Filled 2022-12-27: qty 2

## 2022-12-27 MED ORDER — DIPHENHYDRAMINE HCL 12.5 MG/5ML PO ELIX
12.5000 mg | ORAL_SOLUTION | ORAL | Status: DC | PRN
  Administered 2022-12-27: 12.5 mg via ORAL
  Filled 2022-12-27: qty 5

## 2022-12-27 MED ORDER — PANTOPRAZOLE SODIUM 40 MG PO TBEC
40.0000 mg | DELAYED_RELEASE_TABLET | Freq: Every day | ORAL | Status: DC
  Administered 2022-12-27 – 2022-12-29 (×3): 40 mg via ORAL
  Filled 2022-12-27 (×3): qty 1

## 2022-12-27 MED ORDER — LACTATED RINGERS IV SOLN: INTRAVENOUS | Status: DC

## 2022-12-27 MED ORDER — CEFAZOLIN SODIUM-DEXTROSE 2-4 GM/100ML-% IV SOLN
2.0000 g | INTRAVENOUS | Status: AC
  Administered 2022-12-27: 2 g via INTRAVENOUS
  Filled 2022-12-27: qty 100

## 2022-12-27 MED ORDER — FENTANYL CITRATE PF 50 MCG/ML IJ SOSY
PREFILLED_SYRINGE | INTRAMUSCULAR | Status: AC
  Administered 2022-12-27: 50 ug via INTRAVENOUS
  Filled 2022-12-27: qty 2

## 2022-12-27 MED ORDER — FENTANYL CITRATE (PF) 100 MCG/2ML IJ SOLN
INTRAMUSCULAR | Status: DC | PRN
  Administered 2022-12-27: 50 ug via INTRAVENOUS

## 2022-12-27 MED ORDER — POVIDONE-IODINE 10 % EX SWAB: 2.0000 | Freq: Once | CUTANEOUS | Status: DC

## 2022-12-27 MED ORDER — PHENYLEPHRINE HCL-NACL 20-0.9 MG/250ML-% IV SOLN
INTRAVENOUS | Status: AC
  Filled 2022-12-27: qty 1000

## 2022-12-27 MED ORDER — ONDANSETRON HCL 4 MG/2ML IJ SOLN: 4.0000 mg | Freq: Four times a day (QID) | INTRAMUSCULAR | Status: DC | PRN

## 2022-12-27 MED ORDER — MENTHOL 3 MG MT LOZG: 1.0000 | LOZENGE | OROMUCOSAL | Status: DC | PRN

## 2022-12-27 MED ORDER — CHLORHEXIDINE GLUCONATE 0.12 % MT SOLN
15.0000 mL | Freq: Once | OROMUCOSAL | Status: AC
  Administered 2022-12-27: 15 mL via OROMUCOSAL

## 2022-12-27 MED ORDER — ASPIRIN 81 MG PO CHEW
81.0000 mg | CHEWABLE_TABLET | Freq: Two times a day (BID) | ORAL | Status: DC
  Administered 2022-12-27 – 2022-12-29 (×4): 81 mg via ORAL
  Filled 2022-12-27 (×4): qty 1

## 2022-12-27 MED ORDER — HYDROMORPHONE HCL 1 MG/ML IJ SOLN: 0.5000 mg | INTRAMUSCULAR | Status: DC | PRN

## 2022-12-27 MED ORDER — ACETAMINOPHEN 325 MG PO TABS
325.0000 mg | ORAL_TABLET | Freq: Four times a day (QID) | ORAL | Status: DC | PRN
  Administered 2022-12-28 – 2022-12-29 (×4): 650 mg via ORAL
  Filled 2022-12-27 (×4): qty 2

## 2022-12-27 MED ORDER — FENTANYL CITRATE (PF) 100 MCG/2ML IJ SOLN
INTRAMUSCULAR | Status: AC
  Filled 2022-12-27: qty 2

## 2022-12-27 MED ORDER — AMISULPRIDE (ANTIEMETIC) 5 MG/2ML IV SOLN: 10.0000 mg | Freq: Once | INTRAVENOUS | Status: DC | PRN

## 2022-12-27 MED ORDER — ALUM & MAG HYDROXIDE-SIMETH 200-200-20 MG/5ML PO SUSP: 30.0000 mL | ORAL | Status: DC | PRN

## 2022-12-27 MED ORDER — OXYCODONE HCL 5 MG PO TABS
10.0000 mg | ORAL_TABLET | ORAL | Status: DC | PRN
  Administered 2022-12-27: 15 mg via ORAL
  Administered 2022-12-27: 10 mg via ORAL
  Administered 2022-12-27: 5 mg via ORAL
  Administered 2022-12-29: 10 mg via ORAL
  Filled 2022-12-27 (×2): qty 2
  Filled 2022-12-27: qty 3

## 2022-12-27 MED ORDER — SODIUM CHLORIDE 0.9 % IV SOLN: INTRAVENOUS | Status: DC

## 2022-12-27 MED ORDER — METHOCARBAMOL 500 MG IVPB - SIMPLE MED
500.0000 mg | Freq: Four times a day (QID) | INTRAVENOUS | Status: DC | PRN
  Administered 2022-12-27: 500 mg via INTRAVENOUS
  Filled 2022-12-27: qty 55

## 2022-12-27 MED ORDER — TRANEXAMIC ACID-NACL 1000-0.7 MG/100ML-% IV SOLN
1000.0000 mg | INTRAVENOUS | Status: AC
  Administered 2022-12-27: 1000 mg via INTRAVENOUS
  Filled 2022-12-27: qty 100

## 2022-12-27 MED ORDER — OXYCODONE HCL 5 MG PO TABS: 5.0000 mg | ORAL_TABLET | Freq: Once | ORAL | Status: DC | PRN

## 2022-12-27 MED ORDER — BUPIVACAINE IN DEXTROSE 0.75-8.25 % IT SOLN
INTRATHECAL | Status: DC | PRN
  Administered 2022-12-27: 1.7 mL via INTRATHECAL

## 2022-12-27 MED ORDER — PROPOFOL 10 MG/ML IV BOLUS
INTRAVENOUS | Status: DC | PRN
Start: 2022-12-27 — End: 2022-12-27
  Administered 2022-12-27 (×3): 20 mg via INTRAVENOUS

## 2022-12-27 MED ORDER — SODIUM CHLORIDE 0.9 % IR SOLN
Status: DC | PRN
  Administered 2022-12-27: 1000 mL

## 2022-12-27 MED ORDER — PHENOL 1.4 % MT LIQD: 1.0000 | OROMUCOSAL | Status: DC | PRN

## 2022-12-27 MED ORDER — CEFAZOLIN SODIUM-DEXTROSE 1-4 GM/50ML-% IV SOLN
1.0000 g | Freq: Four times a day (QID) | INTRAVENOUS | Status: AC
  Administered 2022-12-27 (×2): 1 g via INTRAVENOUS
  Filled 2022-12-27 (×2): qty 50

## 2022-12-27 MED ORDER — POLYETHYLENE GLYCOL 3350 17 G PO PACK: 17.0000 g | PACK | Freq: Every day | ORAL | Status: DC | PRN

## 2022-12-27 MED ORDER — OXYCODONE HCL 5 MG/5ML PO SOLN: 5.0000 mg | Freq: Once | ORAL | Status: DC | PRN

## 2022-12-27 MED ORDER — 0.9 % SODIUM CHLORIDE (POUR BTL) OPTIME
TOPICAL | Status: DC | PRN
  Administered 2022-12-27: 1000 mL

## 2022-12-27 MED ORDER — FENTANYL CITRATE PF 50 MCG/ML IJ SOSY
25.0000 ug | PREFILLED_SYRINGE | INTRAMUSCULAR | Status: DC | PRN
  Administered 2022-12-27: 50 ug via INTRAVENOUS

## 2022-12-27 MED ORDER — ACETAMINOPHEN 500 MG PO TABS
1000.0000 mg | ORAL_TABLET | Freq: Once | ORAL | Status: AC
  Administered 2022-12-27: 1000 mg via ORAL
  Filled 2022-12-27: qty 2

## 2022-12-27 MED ORDER — PROPOFOL 500 MG/50ML IV EMUL
INTRAVENOUS | Status: DC | PRN
  Administered 2022-12-27: 80 ug/kg/min via INTRAVENOUS

## 2022-12-27 MED ORDER — METOCLOPRAMIDE HCL 5 MG/ML IJ SOLN: 5.0000 mg | Freq: Three times a day (TID) | INTRAMUSCULAR | Status: DC | PRN

## 2022-12-27 MED ORDER — OXYCODONE HCL 5 MG PO TABS
5.0000 mg | ORAL_TABLET | ORAL | Status: DC | PRN
  Filled 2022-12-27: qty 1

## 2022-12-27 MED ORDER — ZOLPIDEM TARTRATE 5 MG PO TABS: 5.0000 mg | ORAL_TABLET | Freq: Every evening | ORAL | Status: DC | PRN

## 2022-12-27 MED ORDER — ACETAMINOPHEN 10 MG/ML IV SOLN: 1000.0000 mg | Freq: Once | INTRAVENOUS | Status: DC | PRN

## 2022-12-27 MED ORDER — ONDANSETRON HCL 4 MG/2ML IJ SOLN
INTRAMUSCULAR | Status: DC | PRN
  Administered 2022-12-27: 4 mg via INTRAVENOUS

## 2022-12-27 MED ORDER — MIDAZOLAM HCL 5 MG/5ML IJ SOLN
INTRAMUSCULAR | Status: DC | PRN
  Administered 2022-12-27: 2 mg via INTRAVENOUS

## 2022-12-27 MED ORDER — ORAL CARE MOUTH RINSE: 15.0000 mL | Freq: Once | OROMUCOSAL | Status: AC

## 2022-12-27 MED ORDER — METHOCARBAMOL 500 MG PO TABS
500.0000 mg | ORAL_TABLET | Freq: Four times a day (QID) | ORAL | Status: DC | PRN
  Filled 2022-12-27: qty 1

## 2022-12-27 MED ORDER — METHOCARBAMOL 500 MG IVPB - SIMPLE MED
INTRAVENOUS | Status: AC
  Filled 2022-12-27: qty 55

## 2022-12-27 SURGICAL SUPPLY — 47 items
APL SKNCLS STERI-STRIP NONHPOA (GAUZE/BANDAGES/DRESSINGS)
BAG COUNTER SPONGE SURGICOUNT (BAG) ×1 IMPLANT
BAG SPEC THK2 15X12 ZIP CLS (MISCELLANEOUS)
BAG SPNG CNTER NS LX DISP (BAG) ×1
BAG ZIPLOCK 12X15 (MISCELLANEOUS) IMPLANT
BALL HIP CERAMIC (Hips) IMPLANT
BENZOIN TINCTURE PRP APPL 2/3 (GAUZE/BANDAGES/DRESSINGS) IMPLANT
BLADE SAW SGTL 18X1.27X75 (BLADE) ×1 IMPLANT
COVER PERINEAL POST (MISCELLANEOUS) ×1 IMPLANT
COVER SURGICAL LIGHT HANDLE (MISCELLANEOUS) ×1 IMPLANT
CUP SECTOR GRIPTON 50MM (Cup) IMPLANT
DRAPE FOOT SWITCH (DRAPES) ×1 IMPLANT
DRAPE STERI IOBAN 125X83 (DRAPES) ×1 IMPLANT
DRAPE U-SHAPE 47X51 STRL (DRAPES) ×2 IMPLANT
DRSG AQUACEL AG ADV 3.5X10 (GAUZE/BANDAGES/DRESSINGS) ×1 IMPLANT
DURAPREP 26ML APPLICATOR (WOUND CARE) ×1 IMPLANT
ELECT REM PT RETURN 15FT ADLT (MISCELLANEOUS) ×1 IMPLANT
FEM STEM 12/14 TAPER SZ 4 HIP (Orthopedic Implant) ×1 IMPLANT
FEMORAL STEM 12/14 TPR SZ4 HIP (Orthopedic Implant) IMPLANT
GAUZE XEROFORM 1X8 LF (GAUZE/BANDAGES/DRESSINGS) IMPLANT
GLOVE BIO SURGEON STRL SZ7.5 (GLOVE) ×1 IMPLANT
GLOVE BIOGEL PI IND STRL 8 (GLOVE) ×2 IMPLANT
GLOVE ECLIPSE 8.0 STRL XLNG CF (GLOVE) ×1 IMPLANT
GOWN STRL REUS W/ TWL XL LVL3 (GOWN DISPOSABLE) ×2 IMPLANT
GOWN STRL REUS W/TWL XL LVL3 (GOWN DISPOSABLE) ×2
HANDPIECE INTERPULSE COAX TIP (DISPOSABLE) ×1
HEAD FEMORAL 32 CERAMIC (Hips) IMPLANT
HIP BALL CERAMIC (Hips) IMPLANT
HOLDER FOLEY CATH W/STRAP (MISCELLANEOUS) ×1 IMPLANT
KIT TURNOVER KIT A (KITS) IMPLANT
LINER ACET PNNCL PLUS4 NEUTRAL (Hips) IMPLANT
PACK ANTERIOR HIP CUSTOM (KITS) ×1 IMPLANT
PINNACLE PLUS 4 NEUTRAL (Hips) ×1 IMPLANT
SCREW 6.5MMX25MM (Screw) IMPLANT
SET HNDPC FAN SPRY TIP SCT (DISPOSABLE) ×1 IMPLANT
STAPLER VISISTAT 35W (STAPLE) IMPLANT
STRIP CLOSURE SKIN 1/2X4 (GAUZE/BANDAGES/DRESSINGS) IMPLANT
SUT ETHIBOND NAB CT1 #1 30IN (SUTURE) ×1 IMPLANT
SUT ETHILON 2 0 PS N (SUTURE) IMPLANT
SUT MNCRL AB 4-0 PS2 18 (SUTURE) IMPLANT
SUT VIC AB 0 CT1 36 (SUTURE) ×1 IMPLANT
SUT VIC AB 1 CT1 36 (SUTURE) ×1 IMPLANT
SUT VIC AB 2-0 CT1 27 (SUTURE) ×2
SUT VIC AB 2-0 CT1 TAPERPNT 27 (SUTURE) ×2 IMPLANT
TRAY FOLEY MTR SLVR 14FR STAT (SET/KITS/TRAYS/PACK) IMPLANT
TRAY FOLEY MTR SLVR 16FR STAT (SET/KITS/TRAYS/PACK) IMPLANT
YANKAUER SUCT BULB TIP NO VENT (SUCTIONS) ×1 IMPLANT

## 2022-12-27 NOTE — Op Note (Signed)
Operative Note  Date of operation: 12/27/2022 Preoperative diagnosis: Right hip avascular necrosis Postoperative diagnosis: Same  Procedure: Right direct anterior total hip arthroplasty  Implants: Implant Name Type Inv. Item Serial No. Manufacturer Lot No. LRB No. Used Action  CUP SECTOR GRIPTON - [id number] Cup CUP SECTOR GRIPTON  [employer] [id number] Right 1 Implanted  SCREW 6.5MMX25MM - [id number] Screw SCREW 6.5MMX25MM  DEPU[employer] ORTH[employer] [id number] Right 1 Implanted  PINNACLE PLUS 4 NEUTRAL - [id number] Hips PINNACLE PLUS 4 NEUTRAL  DEPUY ORTH[employer] [id number] Right 1 Implanted  FEM STEM 12/14 TAPER SZ 4 HIP - [id number] Orthopedic Implant FEM STEM 12/14 TAPER SZ 4 HIP  DEPUY ORTH[employer] [id number] Right 1 Implanted  HEAD FEMORAL 32 CERAMIC - [id number] Hips HEAD FEMORAL 32 CERAMIC  [employer]PU[employer]TH[employer] [id number] Right 1 Implanted   Surgeon: Vanita Panda. Magnus Ivan, MD Assistant: Rexene Edison, PA-C  Anesthesia: Spinal EBL: 200 cc Antibiotics: 2 g IV Ancef Complications: None  Indications: The patient is a 62 year old female with a well-known avascular necrosis involving both of her hips.  Earlier this year she underwent successful left total hip replacement and now she represents to have her right hip replaced.  Her right hip is significant deformed from her AVN.  Having had surgery before she is fully aware of the risks and benefits of the surgery and this has been discussed in detail.  Her right hip pain is daily and it is severe.  Procedure description: After informed consent was obtained and the appropriate right hip was marked, the patient was brought to the operating room and sat up on the stretcher where spinal anesthesia was obtained.  She was then laid in supine position on the stretcher and a Foley catheter was placed.  We assessed her leg lengths and is definitely shorter on the right side than the left.  Traction boots were placed on both her feet and she was placed  supine on the Hana fracture table with a perineal post in place in both legs and inline skeletal traction devices but no traction applied.  Her hip and pelvis were assessed radiographically.  The right hip was then prepped and draped in DuraPrep and sterile drapes.  A timeout was called and she was identified as the correct patient the correct right hip.  An incision was then made just inferior and posterior to the ASIS and carried slightly obliquely down the leg.  Dissection was carried down to the tensor fascia lata muscle and the tensor fascia was then divided longitudinally to proceed with a direct anterior approach to the hip.  Circumflex vessels were identified cauterized.  The hip capsule was identified and opened up in L-type format finding a moderate joint effusion.  Cobra retractors were placed around the medial and lateral femoral neck and a femoral neck cut was made with an oscillating saw just proximal to the lesser trochanter and this Was completed with an osteotome.  A corkscrew guide was placed in the femoral head and the femoral head was removed its entirety and it was significantly deformed and consistent with AVN.  A bent Hohmann was then placed over the medial acetabular rim and remnants of the acetabular labrum and other debris removed.  Reaming was then initiated from a size 43 reamer and stepwise increments going up to a size 49 reamer with all reamers placed under direct visualization and the last reamer was placed under direct fluoroscopy in order to obtain the depth of reaming, the inclination and the anteversion.  The real  DePuy sector GRIPTION acetabular component size 50 was then placed without difficulty and a single dome screw was placed as well.  We then with a 32+4 polythene liner.  Attention was then turned to the femur.  With the right leg externally rotated to 120 degrees, extended and adducted, a Mueller retractor was placed medially and a Hohmann retractor was placed behind the  greater trochanter.  A box cutting osteotome was used to enter the femoral canal.  The lateral joint capsule was lateralized.  Broaching was then initiated using the Actis broaching system from a size 0 going up to a size 3.  With a size 3 in place we trialed a standard offset femoral neck and a 32+1 hip ball.  The leg was brought over and up and with traction and internal rotation was reduced in the pelvis.  Based on radiographic and clinical assessment we felt like we needed more offset and more leg length.  We also felt the stem could go to a bigger size.  We then dislocated the hip and remove the trial components.  We broached up to a size 4 and based on radiographic assessment we removed a size 4 broach and placed the real Actis femoral component size 4 with high offset.  We then went with a 32+5 ceramic head ball feeling like we needed to make up a lot of leg length.  However we had a difficult time getting that reduced in the pelvis so we had to remove that 32+5 ceramic head ball and placed a 32+1 ceramic head ball.  This was reduced in the pelvis and we are pleased with leg length, offset, range of motion and stability assessed both radiographically and and clinically/mechanically.  The soft tissue was then irrigated with normal saline solution.  Remnants of the joint capsule were closed with interrupted #1 Ethibond suture followed by a #1 Vicryl to close the tensor fascia.  0 Vicryl was used to close the deep tissue and 2-0 Vicryl was used to close subcutaneous tissue.  The skin was closed with staples.  An Aquacel dressing was applied.  The patient was taken off of the Hana table and taken to the recovery room in stable condition.  Rexene Edison, PA-C did assist during the entire case and beginning to end and his assistance was crucial and medically necessary for soft tissue management and retraction, helping guide implant placement and a layered closure of the wound.

## 2022-12-27 NOTE — Anesthesia Postprocedure Evaluation (Signed)
Anesthesia Post Note  Patient: Allison Murray  Procedure(s) Performed: RIGHT TOTAL HIP ARTHROPLASTY ANTERIOR APPROACH (Right: Hip)     Patient location during evaluation: PACU Anesthesia Type: MAC and Spinal Level of consciousness: awake and alert Pain management: pain level controlled Vital Signs Assessment: post-procedure vital signs reviewed and stable Respiratory status: spontaneous breathing and respiratory function stable Cardiovascular status: blood pressure returned to baseline and stable Postop Assessment: spinal receding Anesthetic complications: no   No notable events documented.  Last Vitals:  Vitals:   12/27/22 1130 12/27/22 1145  BP: (!) 88/53 (!) 94/58  Pulse: (!) 53 (!) 52  Resp: 12 14  Temp:  (!) 36.3 C  SpO2: 93% 97%    Last Pain:  Vitals:   12/27/22 1130  TempSrc:   PainSc: 1                  Nannette Zill DANIEL

## 2022-12-27 NOTE — Anesthesia Procedure Notes (Signed)
Spinal  Patient location during procedure: OR Start time: 12/27/2022 9:39 AM End time: 12/27/2022 8:49 AM Reason for block: surgical anesthesia Staffing Performed: anesthesiologist  Anesthesiologist: Heather Roberts, MD Performed by: Heather Roberts, MD Authorized by: Heather Roberts, MD   Preanesthetic Checklist Completed: patient identified, IV checked, risks and benefits discussed, surgical consent, monitors and equipment checked, pre-op evaluation and timeout performed Spinal Block Patient position: sitting Prep: DuraPrep Patient monitoring: cardiac monitor, continuous pulse ox and blood pressure Approach: midline Location: L2-3 Injection technique: single-shot Needle Needle type: Pencan  Needle gauge: 24 G Needle length: 9 cm Assessment Events: CSF return Additional Notes Functioning IV was confirmed and monitors were applied. Sterile prep and drape, including hand hygiene and sterile gloves were used. The patient was positioned and the spine was prepped. The skin was anesthetized with lidocaine.  Free flow of clear CSF was obtained prior to injecting local anesthetic into the CSF.  The spinal needle aspirated freely following injection.  The needle was carefully withdrawn.  The patient tolerated the procedure well.

## 2022-12-27 NOTE — Progress Notes (Signed)
Pt reports having small scratch to RLE from dog at home. RN assessed scratch. It is a small scratch below the knee, on the inside of the RLE and is scabbed over.
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
## 2022-12-27 NOTE — Transfer of Care (Signed)
Immediate Anesthesia Transfer of Care Note  Patient: Allison Murray  Procedure(s) Performed: RIGHT TOTAL HIP ARTHROPLASTY ANTERIOR APPROACH (Right: Hip)  Patient Location: PACU  Anesthesia Type:Spinal  Level of Consciousness: sedated  Airway & Oxygen Therapy: Patient Spontanous Breathing and Patient connected to face mask oxygen  Post-op Assessment: Report given to RN and Post -op Vital signs reviewed and stable  Post vital signs: Reviewed and stable  Last Vitals:  Vitals Value Taken Time  BP    Temp    Pulse    Resp 16 12/27/22 1013  SpO2    Vitals shown include unfiled device data.  Last Pain:  Vitals:   12/27/22 0658  TempSrc:   PainSc: 0-No pain         Complications: No notable events documented.

## 2022-12-27 NOTE — Interval H&P Note (Signed)
History and Physical Interval Note: The patient understands that she is here today for right hip replacement to treat her severe right hip avascular necrosis.  There has been no acute or interval change in her medical status.  The risks and benefits of surgery been discussed in detail and informed consent has been obtained.  The right operative hip has been marked.  12/27/2022 7:01 AM  Allison Murray  has presented today for surgery, with the diagnosis of avascular necrosis right hip.  The various methods of treatment have been discussed with the patient and family. After consideration of risks, benefits and other options for treatment, the patient has consented to  Procedure(s): RIGHT TOTAL HIP ARTHROPLASTY ANTERIOR APPROACH (Right) as a surgical intervention.  The patient's history has been reviewed, patient examined, no change in status, stable for surgery.  I have reviewed the patient's chart and labs.  Questions were answered to the patient's satisfaction.     Kathryne Hitch

## 2022-12-27 NOTE — Plan of Care (Signed)
  Problem: Education: Goal: Knowledge of the prescribed therapeutic regimen will improve Outcome: Progressing   Problem: Nutrition: Goal: Adequate nutrition will be maintained Outcome: Progressing   Problem: Pain Managment: Goal: General experience of comfort will improve Outcome: Progressing

## 2022-12-28 DIAGNOSIS — Z96642 Presence of left artificial hip joint: Secondary | ICD-10-CM | POA: Diagnosis present

## 2022-12-28 DIAGNOSIS — Z7982 Long term (current) use of aspirin: Secondary | ICD-10-CM | POA: Diagnosis not present

## 2022-12-28 DIAGNOSIS — Z79899 Other long term (current) drug therapy: Secondary | ICD-10-CM | POA: Diagnosis not present

## 2022-12-28 DIAGNOSIS — H9190 Unspecified hearing loss, unspecified ear: Secondary | ICD-10-CM | POA: Diagnosis present

## 2022-12-28 DIAGNOSIS — M87851 Other osteonecrosis, right femur: Secondary | ICD-10-CM | POA: Diagnosis present

## 2022-12-28 MED ORDER — ASPIRIN 81 MG PO CHEW: 81.0000 mg | CHEWABLE_TABLET | Freq: Two times a day (BID) | ORAL | 0 refills | Status: AC

## 2022-12-28 MED ORDER — POLYETHYLENE GLYCOL 3350 17 G PO PACK: 17.0000 g | PACK | Freq: Every day | ORAL | 0 refills | Status: AC | PRN

## 2022-12-28 MED ORDER — TIZANIDINE HCL 4 MG PO TABS: 4.0000 mg | ORAL_TABLET | Freq: Four times a day (QID) | ORAL | 0 refills | Status: AC | PRN

## 2022-12-28 MED ORDER — OXYCODONE HCL 5 MG PO TABS: 5.0000 mg | ORAL_TABLET | Freq: Four times a day (QID) | ORAL | 0 refills | Status: DC | PRN

## 2022-12-28 MED ORDER — KETOROLAC TROMETHAMINE 15 MG/ML IJ SOLN
7.5000 mg | Freq: Four times a day (QID) | INTRAMUSCULAR | Status: AC
  Administered 2022-12-28 – 2022-12-29 (×3): 7.5 mg via INTRAVENOUS
  Filled 2022-12-28 (×3): qty 1

## 2022-12-28 MED ORDER — TIZANIDINE HCL 4 MG PO TABS: 4.0000 mg | ORAL_TABLET | Freq: Four times a day (QID) | ORAL | Status: DC | PRN

## 2022-12-28 NOTE — Progress Notes (Signed)
Subjective: 1 Day Post-Op Procedure(s) (LRB): RIGHT TOTAL HIP ARTHROPLASTY ANTERIOR APPROACH (Right) Patient reports pain as moderate.  She does state that this surgery on the right hip has been more difficult than she had on the left hip earlier this year.  She is having more pain this time and does not feel like she can make it home today due to pain control issues.  Objective: Vital signs in last 24 hours: Temp:  [97.3 F (36.3 C)-100.1 F (37.8 C)] 99 F (37.2 C) (07/27 0948) Pulse Rate:  [51-83] 79 (07/27 0948) Resp:  [12-18] 16 (07/27 0948) BP: (88-137)/(53-74) 120/66 (07/27 0948) SpO2:  [93 %-100 %] 95 % (07/27 0948)  Intake/Output from previous day: 07/26 0701 - 07/27 0700 In: 1958.8 [I.V.:1858.8; IV Piggyback:100] Out: 1875 [Urine:1775; Blood:100] Intake/Output this shift: Total I/O In: -  Out: 600 [Urine:600]  Recent Labs    12/28/22 0338  HGB 10.4*   Recent Labs    12/28/22 0338  WBC 6.3  RBC 3.25*  HCT 30.4*  PLT 103*   Recent Labs    12/28/22 0338  NA 132*  K 3.4*  CL 105  CO2 21*  BUN 9  CREATININE 0.58  GLUCOSE 121*  CALCIUM 8.2*   No results for input(s): "LABPT", "INR" in the last 72 hours.  Sensation intact distally Intact pulses distally Dorsiflexion/Plantar flexion intact Incision: dressing C/D/I   Assessment/Plan: 1 Day Post-Op Procedure(s) (LRB): RIGHT TOTAL HIP ARTHROPLASTY ANTERIOR APPROACH (Right) Up with therapy Plan for discharge tomorrow  Will work on pain control today and add Toradol for the next 24 hours.  Hopefully will then be able to discharge her to home tomorrow.    Kathryne Hitch 12/28/2022, 11:10 AM

## 2022-12-28 NOTE — Discharge Instructions (Signed)
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
## 2022-12-28 NOTE — Progress Notes (Signed)
Patient pressed call light and requested tylenol specifically. Patient's RN assisting with another patient so this nurse took meds to patient. Upon entering room and introducing self, patient stated she had called out for tylenol prior to shift change and had not rec'd medications. RN apologized and asked pain assessment questions. Patient c/o pain 10/10, burning to the incision. Rn offered stronger analgesia, oxycodone, but patient declined stating she believed it made her itch overnight. Offered muscle relaxer and patient stated that the last time she tried it it had made her nauseated. Offered to administer anti-emetic or diphenhydramine prior to administration but patient declined, "Lets just see how this tylenol does. If I had gotten it when I asked instead of waiting, it wouldn't be this bad!" Patient also stated "This has been the worst care I've ever received. No one came in my room or checked on me last night from five minutes to 10 until 2am and Dr Magnus Ivan is going to hear about it!" Patient elaborated exactly why she knew the exact time based on a conversation she had had w/ Bonita Quin. Gently informed patient based on charting that she had been administered po diphenhydramine at 2330 and then had vitals taken at 0115. Patient appeared calmer and said "I must have slept through that." Patient apologized for tone and being short with RN, RN reassured patient that all was well and that staff would be rounding frequently to assess pain and needs. Followed up w/ assigned RN Amy, Amy stated during BSR that patient had been awake and had denied needs when asked.
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
## 2022-12-29 NOTE — Progress Notes (Signed)
Subjective: 2 Days Post-Op Procedure(s) (LRB): RIGHT TOTAL HIP ARTHROPLASTY ANTERIOR APPROACH (Right) Patient reports pain as moderate.  Having some difficult mobilizing as she says the right leg feels heavy. Tolerating diet.  Objective: Vital signs in last 24 hours: Temp:  [98.9 F (37.2 C)-99.6 F (37.6 C)] 99.6 F (37.6 C) (07/28 0506) Pulse Rate:  [73-80] 79 (07/28 0506) Resp:  [16-17] 16 (07/28 0506) BP: (116-142)/(55-71) 142/71 (07/28 0506) SpO2:  [95 %] 95 % (07/28 0506)  Intake/Output from previous day: 07/27 0701 - 07/28 0700 In: 926.3 [P.O.:360; I.V.:566.3] Out: 950 [Urine:950] Intake/Output this shift: No intake/output data recorded.  Recent Labs    12/28/22 0338  HGB 10.4*   Recent Labs    12/28/22 0338  WBC 6.3  RBC 3.25*  HCT 30.4*  PLT 103*   Recent Labs    12/28/22 0338  NA 132*  K 3.4*  CL 105  CO2 21*  BUN 9  CREATININE 0.58  GLUCOSE 121*  CALCIUM 8.2*   No results for input(s): "LABPT", "INR" in the last 72 hours.  Sensation intact distally Intact pulses distally Dorsiflexion/Plantar flexion intact Incision: dressing C/D/I   Assessment/Plan: 2 Days Post-Op Procedure(s) (LRB): RIGHT TOTAL HIP ARTHROPLASTY ANTERIOR APPROACH (Right)   Plan for mobilization with PT. Possible DC today pending ability to adequately mobilize.    Allison Murray 12/29/2022, 7:41 AM

## 2022-12-29 NOTE — Plan of Care (Signed)
  Problem: Pain Management: Goal: Pain level will decrease with appropriate interventions Outcome: Progressing   Problem: Activity: Goal: Risk for activity intolerance will decrease Outcome: Progressing   Problem: Coping: Goal: Level of anxiety will decrease Outcome: Progressing

## 2022-12-29 NOTE — Discharge Summary (Signed)
Patient ID: Allison Murray MRN: [id number] DOB/AGE: 1961-05-27 62 y.o.  Admit date: 12/27/2022 Discharge date: 12/29/2022  Admission Diagnoses:  Avascular necrosis of bone of right hip [facility])  Discharge Diagnoses:  Principal Problem:   Avascular necrosis of bone of right hip (HCC) Active Problems:   Status post total replacement of right hip   Past Medical History:  Diagnosis Date   Arthritis    hips   ETOH abuse    history of. only a few beers a wekk now   Hearing loss    has 65% hearing    Surgeries: Procedure(s): RIGHT TOTAL HIP ARTHROPLASTY ANTERIOR APPROACH on 12/27/2022   Consultants (if any):   Discharged Condition: Improved  Hospital Course: Allison Murray is an 62 y.o. female who was admitted 12/27/2022 with a diagnosis of Avascular necrosis of bone of right hip (HCC) and went to the operating room on 12/27/2022 and underwent the above named procedures.    She was given perioperative antibiotics:  Anti-infectives (From admission, onward)    Start     Dose/Rate Route Frequency Ordered Stop   12/27/22 1500  ceFAZolin (ANCEF) IVPB 1 g/50 mL premix        1 g 100 mL/hr over 30 Minutes Intravenous Every 6 hours 12/27/22 1200 12/27/22 2037   12/27/22 0645  ceFAZolin (ANCEF) IVPB 2g/100 mL premix        2 g 200 mL/hr over 30 Minutes Intravenous On call to O.R. 12/27/22 3295 12/27/22 0849     .  She was given sequential compression devices, early ambulation, and appropriate chemoprophylaxis for DVT prophylaxis.  She benefited maximally from the hospital stay and there were no complications.    Recent vital signs:  Vitals:   12/28/22 2143 12/29/22 0506  BP: 131/66 (!) 142/71  Pulse: 80 79  Resp: 17 16  Temp: 99.3 F (37.4 C) 99.6 F (37.6 C)  SpO2: 95% 95%    Recent laboratory studies:  Lab Results  Component Value Date   HGB 10.4 (L) 12/28/2022   HGB 13.4 12/19/2022   HGB 8.6 (L) 09/07/2022   Lab Results  Component Value Date   WBC 6.3  12/28/2022   PLT 103 (L) 12/28/2022   No results found for: "INR" Lab Results  Component Value Date   NA 132 (L) 12/28/2022   K 3.4 (L) 12/28/2022   CL 105 12/28/2022   CO2 21 (L) 12/28/2022   BUN 9 12/28/2022   CREATININE 0.58 12/28/2022   GLUCOSE 121 (H) 12/28/2022    Discharge Medications:   Allergies as of 12/29/2022   No Known Allergies      Medication List     STOP taking these medications    methocarbamol 500 MG tablet Commonly known as: ROBAXIN       TAKE these medications    aspirin 81 MG chewable tablet Chew 1 tablet (81 mg total) by mouth 2 (two) times daily.   naproxen sodium 220 MG tablet Commonly known as: ALEVE Take 220 mg by mouth 3 (three) times daily as needed (pain).   oxyCODONE 5 MG immediate release tablet Commonly known as: Oxy IR/ROXICODONE Take 1-2 tablets (5-10 mg total) by mouth every 6 (six) hours as needed for moderate pain (pain score 4-6). No more than 6 tablets daily. What changed: additional instructions   polyethylene glycol 17 g packet Commonly known as: MIRALAX / GLYCOLAX Take 17 g by mouth daily as needed for mild constipation.   tiZANidine 4 MG  tablet Commonly known as: ZANAFLEX Take 1 tablet (4 mg total) by mouth every 6 (six) hours as needed for muscle spasms.               Durable Medical Equipment  (From admission, onward)           Start     Ordered   12/27/22 1201  DME 3 n 1  Once        12/27/22 1200   12/27/22 1201  DME Walker rolling  Once       Question Answer Comment  Walker: With 5 Inch Wheels   Patient needs a walker to treat with the following condition Status post total replacement of right hip      12/27/22 1200            Diagnostic Studies: DG Pelvis Portable  Result Date: 12/27/2022 CLINICAL DATA:  Status post total right hip arthroplasty. Imaged on in PACU. EXAM: PORTABLE PELVIS 1-2 VIEWS COMPARISON:  AP pelvis 09/06/2022. FINDINGS: New total right hip arthroplasty.  Redemonstration of total left hip arthroplasty. Interval removal of the prior left hip surgical skin staples. Resolution of the prior left hip subcutaneous air. Expected postoperative changes about the right hip including subcutaneous air and lateral surgical skin staples. Minimal superior pubic symphysis joint space narrowing and peripheral osteophytes. Mild bilateral sacroiliac subchondral sclerosis. Vascular phleboliths overlie the pelvis. No acute fracture or dislocation. IMPRESSION: New total right hip arthroplasty with expected postoperative changes. Electronically Signed   By: Neita Garnet M.D.   On: 12/27/2022 11:33   DG HIP UNILAT WITH PELVIS 1V RIGHT  Result Date: 12/27/2022 CLINICAL DATA:  Total right hip arthroplasty anterior approach. Intraoperative fluoroscopy. EXAM: DG HIP (WITH OR WITHOUT PELVIS) 1V RIGHT COMPARISON:  AP pelvis 09/06/2022 FINDINGS: Images were performed intraoperatively without the presence of a radiologist. New total right hip arthroplasty. Redemonstration of left hip arthroplasty, partially visualized. No hardware complication is seen. Vascular phleboliths overlie the pelvis. Total fluoroscopy images: 3 Total fluoroscopy time: 18 seconds Total dose: Radiation Exposure Index (as provided by the fluoroscopic device): 1.65 mGy air Kerma Please see intraoperative findings for further detail. IMPRESSION: Intraoperative fluoroscopy for total right hip arthroplasty. Electronically Signed   By: Neita Garnet M.D.   On: 12/27/2022 11:32   DG C-Arm 1-60 Min-No Report  Result Date: 12/27/2022 Fluoroscopy was utilized by the requesting physician.  No radiographic interpretation.   DG C-Arm 1-60 Min-No Report  Result Date: 12/27/2022 Fluoroscopy was utilized by the requesting physician.  No radiographic interpretation.    Disposition: Discharge disposition: 01-Home or Self Care          Follow-up Information     Kathryne Hitch, MD Follow up in 2 week(s).    Specialty: Orthopedic Surgery Contact information: [address] [place] [address] [phone]                  Signed: Huel Cote 12/29/2022, 1:36 PM

## 2023-01-09 ENCOUNTER — Encounter: Payer: Self-pay | Admitting: Physician Assistant

## 2023-01-09 ENCOUNTER — Ambulatory Visit (INDEPENDENT_AMBULATORY_CARE_PROVIDER_SITE_OTHER): Payer: Medicaid Other | Admitting: Physician Assistant

## 2023-01-09 DIAGNOSIS — Z96641 Presence of right artificial hip joint: Secondary | ICD-10-CM

## 2023-01-09 MED ORDER — OXYCODONE HCL 5 MG PO TABS: 5.0000 mg | ORAL_TABLET | Freq: Four times a day (QID) | ORAL | 0 refills | Status: AC | PRN

## 2023-01-09 NOTE — Progress Notes (Signed)
HPI: Ms. Quain returns today status post right total hip arthroplasty 12/27/2022.  She is ambulating with crutches.  Ranks her pain to be 7 out of 10 pain at worst.  She is on aspirin for DVT prophylaxis.  She is also taking oxycodone for pain.  She denies any fevers chills.  Does have pain in her anterior right thigh.  Physical exam: Right hip good range of motion.  She is able to ambulate about the room without any assistive device.  Surgical incisions healing well no signs of infection.  Skin is well-approximated with staples.  Right calf supple nontender.  Dorsiflexion plantarflexion right ankle intact.  Impression: Status post right total hip arthroplasty  Plan: Staples harvested Steri-Strips applied.  She will continue to work on range of motion and strengthening.  Remain on her aspirin 81 mg once daily for another week and then discontinue.  She will follow-up with [place] in 1 month sooner if there is any questions concerns.                                                                                                                                                            1q

## 2023-01-28 ENCOUNTER — Telehealth: Payer: Self-pay | Admitting: Orthopaedic Surgery

## 2023-01-28 NOTE — Telephone Encounter (Signed)
Patient called. Would like to know if she could get in a swimming pool? Her cb# [phone]

## 2023-01-29 NOTE — Telephone Encounter (Signed)
Voicemail Mailbox full, will try again later

## 2023-01-29 NOTE — Telephone Encounter (Signed)
Patient aware of the below message  

## 2023-01-30 IMAGING — DX DG KNEE COMPLETE 4+V*R*
4 series · 4 of 4 positions shown · non-contrast
Comparison: None.

CLINICAL DATA: Right knee pain unspecified chronicity.

EXAM:
RIGHT KNEE - COMPLETE 4+ VIEW

[knee ap]
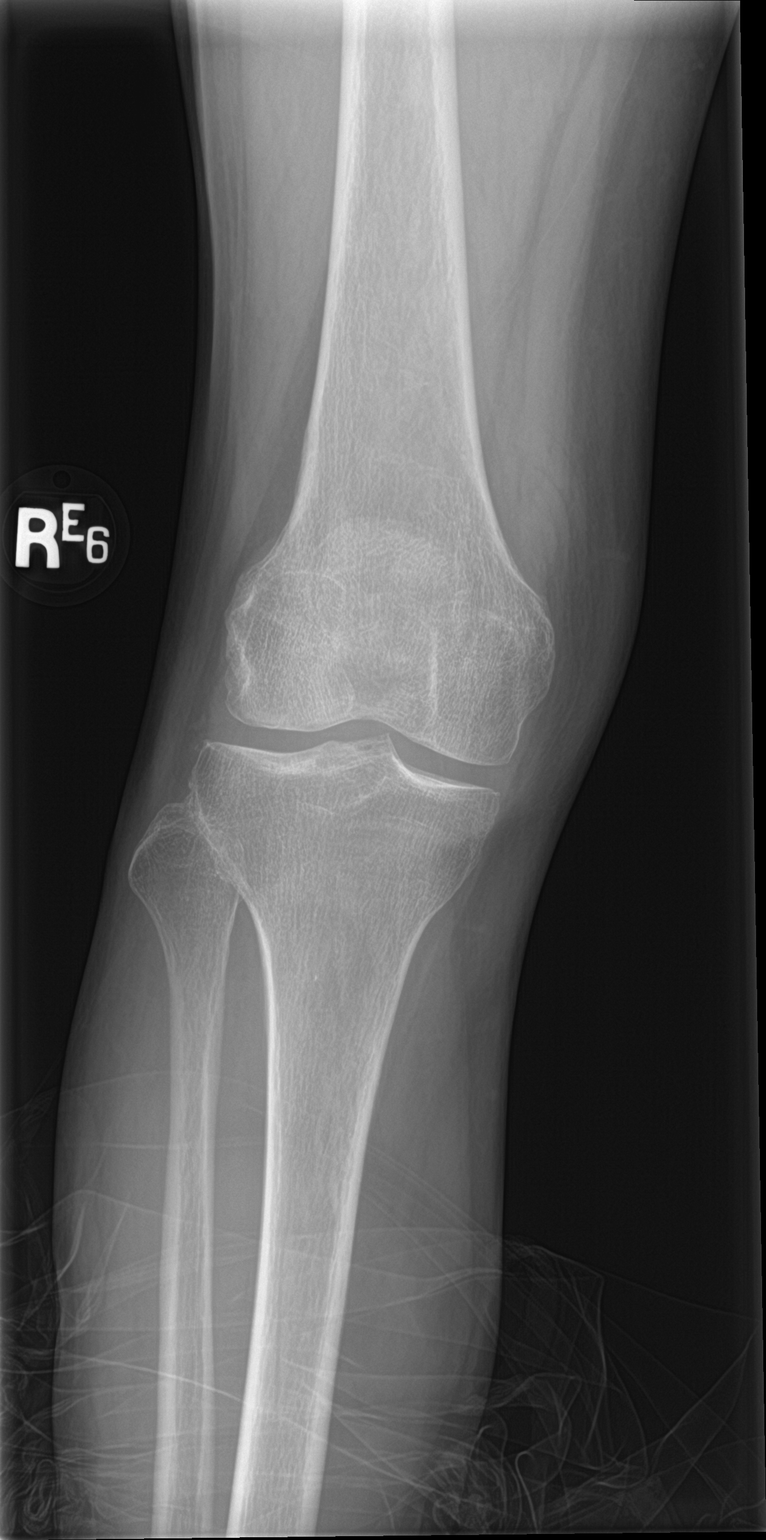

[knee obl (1 of 2)]
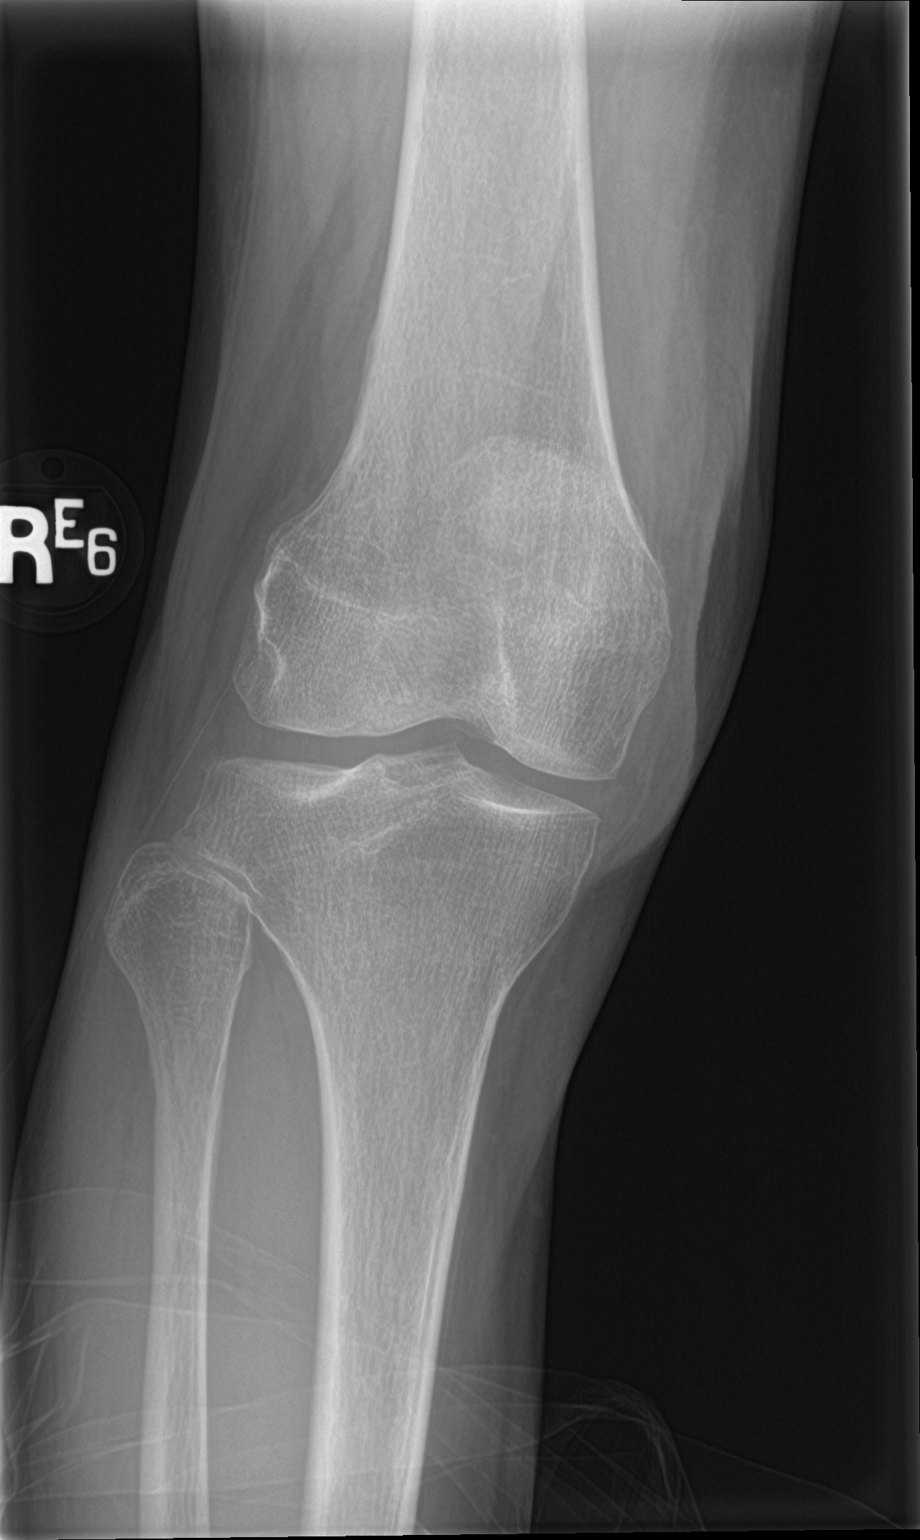

[knee obl (2 of 2)]
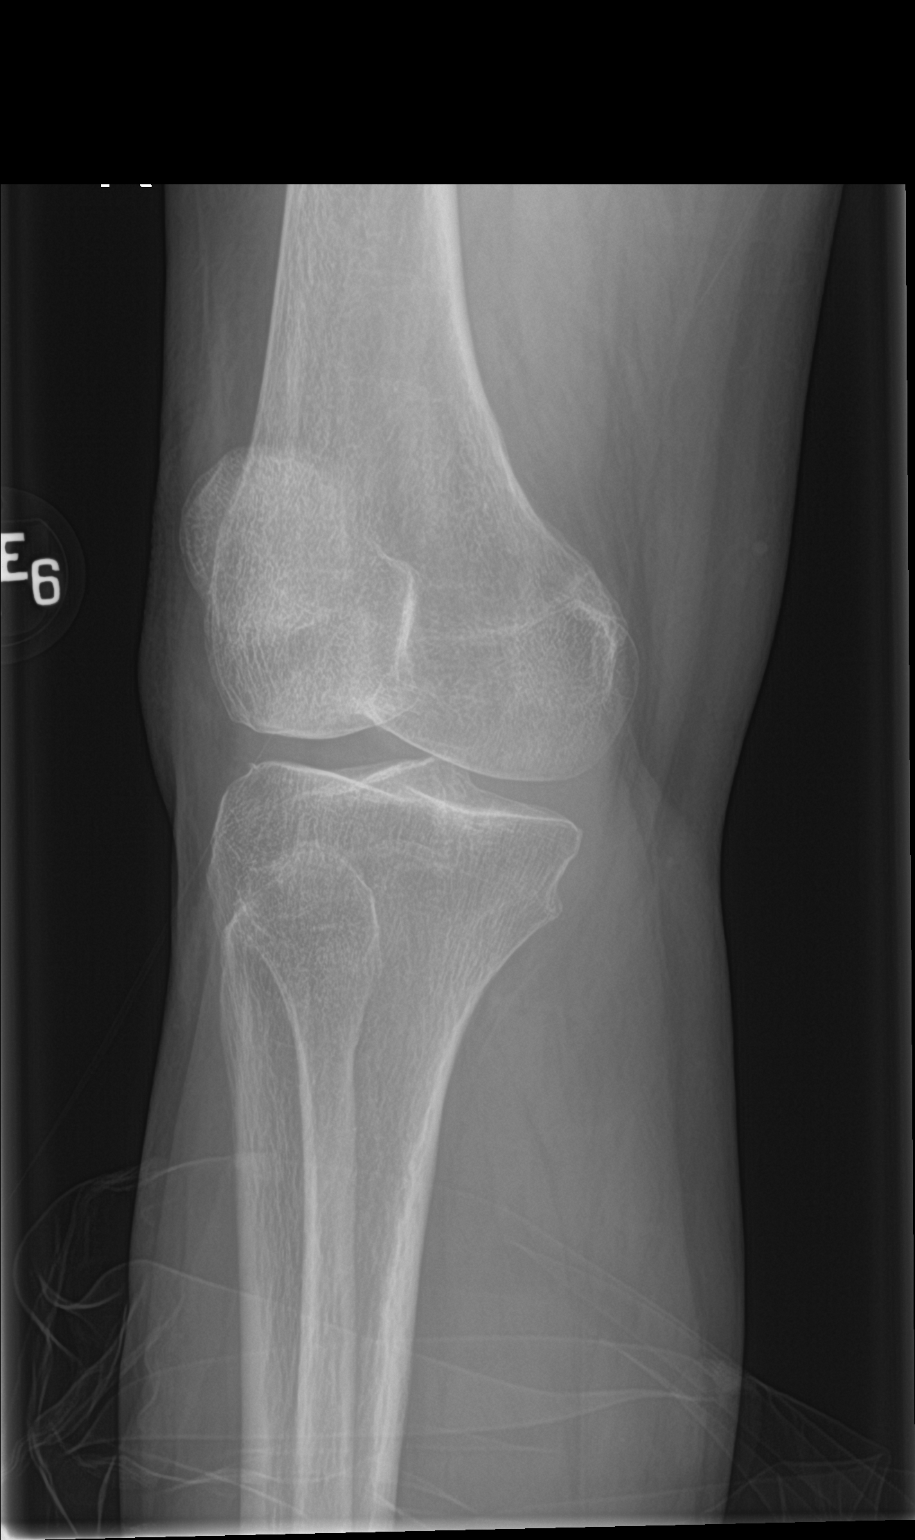

[knee lat]
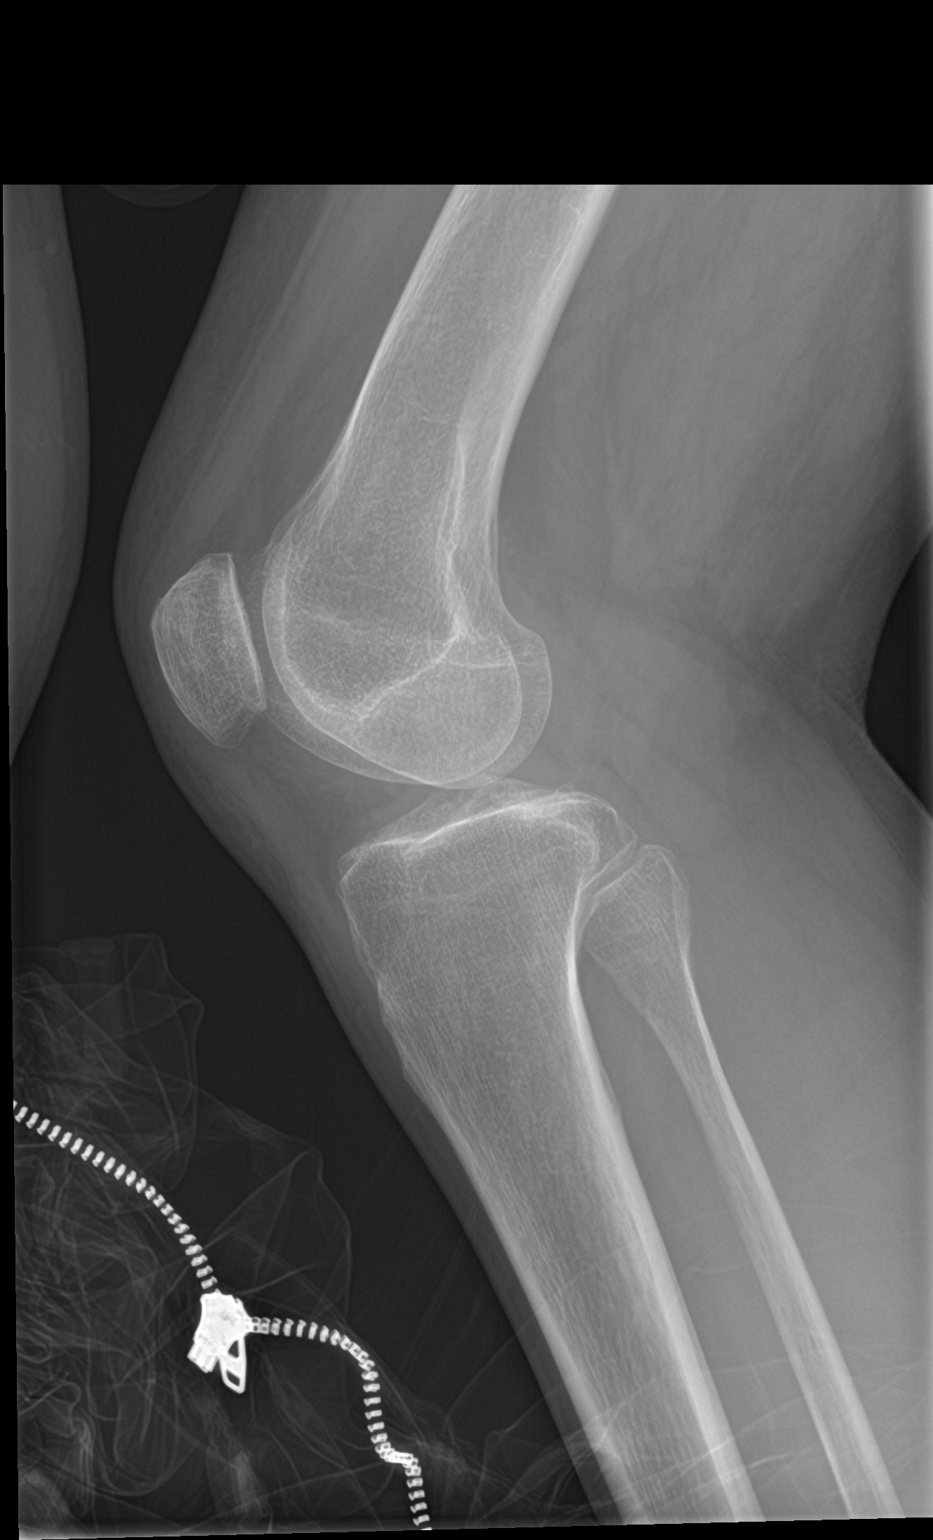

[4 of 4 positions shown; findings below may reference images not displayed]

FINDINGS: Joint spaces are preserved. No joint effusion. No acute fracture or
dislocation.
IMPRESSION: No significant osteoarthritis.

## 2023-02-06 ENCOUNTER — Encounter: Payer: Self-pay | Admitting: Orthopaedic Surgery

## 2023-02-06 ENCOUNTER — Ambulatory Visit (INDEPENDENT_AMBULATORY_CARE_PROVIDER_SITE_OTHER): Payer: Medicaid Other | Admitting: Orthopaedic Surgery

## 2023-02-06 DIAGNOSIS — Z96641 Presence of right artificial hip joint: Secondary | ICD-10-CM

## 2023-02-06 DIAGNOSIS — Z96642 Presence of left artificial hip joint: Secondary | ICD-10-CM

## 2023-02-06 NOTE — Progress Notes (Signed)
The patient is 6 weeks status post a right total hip arthroplasty in about 4 months status post a left hip replacement.  Please were secondary to avascular necrosis.  She is 62 years old.  She is doing well overall.  She reports good range of motion and strength.  She walks with a normal-appearing gait.  Her leg lengths look equal.  Both operative hips move smoothly.  From my standpoint the next time we need to see here is before she is considering moving to [place] after Thanksgiving.  With that being said we will see her in about 10 weeks.  At that visit we will have a standing low AP pelvis.

## 2023-04-17 ENCOUNTER — Ambulatory Visit: Payer: Medicaid Other | Admitting: Orthopaedic Surgery

## 2023-07-22 ENCOUNTER — Emergency Department (HOSPITAL_COMMUNITY)
Admission: EM | Admit: 2023-07-22 | Discharge: 2023-07-23 | Disposition: A | Discharge status: Home / Self Care | Payer: Medicaid Other | Attending: Emergency Medicine | Admitting: Emergency Medicine

## 2023-07-22 ENCOUNTER — Other Ambulatory Visit: Payer: Self-pay

## 2023-07-22 DIAGNOSIS — Z20822 Contact with and (suspected) exposure to covid-19: Secondary | ICD-10-CM | POA: Insufficient documentation

## 2023-07-22 DIAGNOSIS — J101 Influenza due to other identified influenza virus with other respiratory manifestations: Secondary | ICD-10-CM | POA: Insufficient documentation

## 2023-07-22 DIAGNOSIS — Z7982 Long term (current) use of aspirin: Secondary | ICD-10-CM | POA: Diagnosis not present

## 2023-07-22 DIAGNOSIS — R509 Fever, unspecified: Secondary | ICD-10-CM | POA: Diagnosis present

## 2023-07-22 NOTE — ED Triage Notes (Signed)
 Pt with flu like symptoms with fever cough and chills.

## 2023-07-23 LAB — RESP PANEL BY RT-PCR (RSV, FLU A&B, COVID)  RVPGX2
Influenza A by PCR: POSITIVE — AB
Influenza B by PCR: NEGATIVE
Resp Syncytial Virus by PCR: NEGATIVE
SARS Coronavirus 2 by RT PCR: NEGATIVE

## 2023-07-23 MED ORDER — OSELTAMIVIR PHOSPHATE 75 MG PO CAPS: 75.0000 mg | ORAL_CAPSULE | Freq: Two times a day (BID) | ORAL | 0 refills | Status: AC

## 2023-07-23 MED ORDER — KETOROLAC TROMETHAMINE 30 MG/ML IJ SOLN
30.0000 mg | Freq: Once | INTRAMUSCULAR | Status: AC
  Administered 2023-07-23: 30 mg via INTRAMUSCULAR
  Filled 2023-07-23: qty 1

## 2023-07-23 NOTE — ED Provider Notes (Signed)
 [place] EMERGENCY DEPARTMENT AT [facility] Provider Note   CSN: [id number] Arrival date & time: 07/22/23  2255     History  Chief Complaint  Patient presents with   Cough   Fever    PUNAM BROUSSARD is a 63 y.o. female.  The history is provided by the patient.  Patient w/history of arthritis presents with flulike illness.  This been ongoing for the past 1 to 2 days.  She reports cough, fever, sore throat and headaches.  She reports chills and myalgias throughout her body. She reports sick contacts    Past Medical History:  Diagnosis Date   Arthritis    hips   ETOH abuse    history of. only a few beers a wekk now   Hearing loss    has 65% hearing    Home Medications Prior to Admission medications   Medication Sig Start Date End Date Taking? Authorizing Provider  oseltamivir (TAMIFLU) 75 MG capsule Take 1 capsule (75 mg total) by mouth every 12 (twelve) hours. 07/23/23  Yes Zadie Rhine, MD  aspirin 81 MG chewable tablet Chew 1 tablet (81 mg total) by mouth 2 (two) times daily. 12/28/22   Kathryne Hitch, MD  naproxen sodium (ALEVE) 220 MG tablet Take 220 mg by mouth 3 (three) times daily as needed (pain).    [provider]  oxyCODONE (OXY IR/ROXICODONE) 5 MG immediate release tablet Take 1-2 tablets (5-10 mg total) by mouth every 6 (six) hours as needed for moderate pain (pain score 4-6). No more than 6 tablets daily. 01/09/23   Kirtland Bouchard, PA-C  polyethylene glycol (MIRALAX / GLYCOLAX) 17 g packet Take 17 g by mouth daily as needed for mild constipation. 12/28/22   Kathryne Hitch, MD  tiZANidine (ZANAFLEX) 4 MG tablet Take 1 tablet (4 mg total) by mouth every 6 (six) hours as needed for muscle spasms. 12/28/22   Kathryne Hitch, MD      Allergies    Patient has no known allergies.    Review of Systems   Review of Systems  Physical Exam Updated Vital Signs BP (!) 148/83 (BP Location: Right Arm)   Pulse 90   Temp  (!) 100.9 F (38.3 C) (Oral)   Resp (!) 22   LMP 01/07/2012   SpO2 96%  Physical Exam CONSTITUTIONAL: Chronically ill-appearing, no acute distress HEAD: Normocephalic/atraumatic EYES: EOMI/PERRL ENMT: Mucous membranes moist, no stridor or drooling NECK: supple no meningeal signs CV: S1/S2 noted, no murmurs/rubs/gallops noted LUNGS: Lungs are clear to auscultation bilaterally, no apparent distress ABDOMEN: soft NEURO: Pt is awake/alert/appropriate, moves all extremitiesx4.  No facial droop.   PSYCH: Anxious  ED Results / Procedures / Treatments   Labs (all labs ordered are listed, but only abnormal results are displayed) Labs Reviewed  RESP PANEL BY RT-PCR (RSV, FLU A&B, COVID)  RVPGX2 - Abnormal; Notable for the following components:      Result Value   Influenza A by PCR POSITIVE (*)    All other components within normal limits    EKG None  Radiology No results found.  Procedures Procedures    Medications Ordered in ED Medications  ketorolac (TORADOL) 30 MG/ML injection 30 mg (30 mg Intramuscular Given 07/23/23 0324)    ED Course/ Medical Decision Making/ A&P  Medical Decision Making Risk Prescription drug management.   Patient presents for flulike symptoms for the past 1 to 2 days.  She is in no acute distress, lung sounds are clear, no hypoxia.  She is in no acute distress speaking loudly and yelling at me during the exam due to her wait time.  Given symptoms less than 48 hours, will start her on Tamiflu.  We discussed strict return precautions        Final Clinical Impression(s) / ED Diagnoses Final diagnoses:  Influenza A    Rx / DC Orders ED Discharge Orders          Ordered    oseltamivir (TAMIFLU) 75 MG capsule  Every 12 hours        07/23/23 0303              Zadie Rhine, MD 07/23/23 [phone]

## 2023-10-01 ENCOUNTER — Other Ambulatory Visit (INDEPENDENT_AMBULATORY_CARE_PROVIDER_SITE_OTHER): Payer: Self-pay

## 2023-10-01 ENCOUNTER — Ambulatory Visit (INDEPENDENT_AMBULATORY_CARE_PROVIDER_SITE_OTHER): Admitting: Orthopaedic Surgery

## 2023-10-01 ENCOUNTER — Encounter: Payer: Self-pay | Admitting: Orthopaedic Surgery

## 2023-10-01 DIAGNOSIS — Z96643 Presence of artificial hip joint, bilateral: Secondary | ICD-10-CM

## 2023-10-01 NOTE — Progress Notes (Signed)
 The patient is a 63 year old female who we replaced both of her hips last year secondary to avascular necrosis.  Her left hip was replaced in April in the right hip in July.  She has a little bit of catching in the right hip but overall is doing well.  She says her leg lengths are equal.  On exam her leg lengths are equal.  Both hips move smoothly and fluidly with no blocks or rotation.  Her incisions are well-healed.  She is a thin and active individual.  I think this is help her posture as well.  An AP pelvis she has bilateral total hip arthroplasties with no complicating features.  We did review her preoperative films as well that showed the severity of her collapsed hips bilaterally.  At this point follow-up should be as needed.  If there are any issues with her hip she should let us  know.  I gave her reassurance that this should continue to improve with time.

## 2024-05-31 ENCOUNTER — Emergency Department (HOSPITAL_COMMUNITY)

## 2024-05-31 ENCOUNTER — Emergency Department (HOSPITAL_COMMUNITY)
Admission: EM | Admit: 2024-05-31 | Discharge: 2024-05-31 | Disposition: A | Discharge status: Home / Self Care | Attending: Emergency Medicine | Admitting: Emergency Medicine

## 2024-05-31 ENCOUNTER — Encounter (HOSPITAL_COMMUNITY): Payer: Self-pay | Admitting: *Deleted

## 2024-05-31 ENCOUNTER — Other Ambulatory Visit: Payer: Self-pay

## 2024-05-31 DIAGNOSIS — X58XXXA Exposure to other specified factors, initial encounter: Secondary | ICD-10-CM | POA: Diagnosis not present

## 2024-05-31 DIAGNOSIS — I8500 Esophageal varices without bleeding: Secondary | ICD-10-CM | POA: Insufficient documentation

## 2024-05-31 DIAGNOSIS — N390 Urinary tract infection, site not specified: Secondary | ICD-10-CM | POA: Insufficient documentation

## 2024-05-31 DIAGNOSIS — S32010A Wedge compression fracture of first lumbar vertebra, initial encounter for closed fracture: Secondary | ICD-10-CM

## 2024-05-31 DIAGNOSIS — K760 Fatty (change of) liver, not elsewhere classified: Secondary | ICD-10-CM | POA: Diagnosis not present

## 2024-05-31 DIAGNOSIS — R160 Hepatomegaly, not elsewhere classified: Secondary | ICD-10-CM

## 2024-05-31 DIAGNOSIS — S3992XA Unspecified injury of lower back, initial encounter: Secondary | ICD-10-CM | POA: Diagnosis present

## 2024-05-31 DIAGNOSIS — S32018A Other fracture of first lumbar vertebra, initial encounter for closed fracture: Secondary | ICD-10-CM | POA: Diagnosis not present

## 2024-05-31 DIAGNOSIS — Z96643 Presence of artificial hip joint, bilateral: Secondary | ICD-10-CM | POA: Insufficient documentation

## 2024-05-31 DIAGNOSIS — R918 Other nonspecific abnormal finding of lung field: Secondary | ICD-10-CM | POA: Insufficient documentation

## 2024-05-31 DIAGNOSIS — E278 Other specified disorders of adrenal gland: Secondary | ICD-10-CM | POA: Insufficient documentation

## 2024-05-31 DIAGNOSIS — R911 Solitary pulmonary nodule: Secondary | ICD-10-CM

## 2024-05-31 LAB — CBC WITH DIFFERENTIAL/PLATELET
Abs Immature Granulocytes: 0.02 K/uL (ref 0.00–0.07)
Basophils Absolute: 0 K/uL (ref 0.0–0.1)
Basophils Relative: 1 %
Eosinophils Absolute: 0.2 K/uL (ref 0.0–0.5)
Eosinophils Relative: 2 %
HCT: 40.8 % (ref 36.0–46.0)
Hemoglobin: 14.1 g/dL (ref 12.0–15.0)
Immature Granulocytes: 0 %
Lymphocytes Relative: 32 %
Lymphs Abs: 2.1 K/uL (ref 0.7–4.0)
MCH: 33.4 pg (ref 26.0–34.0)
MCHC: 34.6 g/dL (ref 30.0–36.0)
MCV: 96.7 fL (ref 80.0–100.0)
Monocytes Absolute: 0.3 K/uL (ref 0.1–1.0)
Monocytes Relative: 5 %
Neutro Abs: 3.9 K/uL (ref 1.7–7.7)
Neutrophils Relative %: 60 %
Platelets: 127 K/uL — ABNORMAL LOW (ref 150–400)
RBC: 4.22 MIL/uL (ref 3.87–5.11)
RDW: 13.9 % (ref 11.5–15.5)
WBC: 6.6 K/uL (ref 4.0–10.5)
nRBC: 0 % (ref 0.0–0.2)

## 2024-05-31 LAB — COMPREHENSIVE METABOLIC PANEL WITH GFR
ALT: 77 U/L — ABNORMAL HIGH (ref 0–44)
AST: 108 U/L — ABNORMAL HIGH (ref 15–41)
Albumin: 3.8 g/dL (ref 3.5–5.0)
Alkaline Phosphatase: 141 U/L — ABNORMAL HIGH (ref 38–126)
Anion gap: 11 (ref 5–15)
BUN: 7 mg/dL — ABNORMAL LOW (ref 8–23)
CO2: 21 mmol/L — ABNORMAL LOW (ref 22–32)
Calcium: 9.2 mg/dL (ref 8.9–10.3)
Chloride: 107 mmol/L (ref 98–111)
Creatinine, Ser: 0.52 mg/dL (ref 0.44–1.00)
GFR, Estimated: >60 mL/min
Glucose, Bld: 90 mg/dL (ref 70–99)
Potassium: 4.4 mmol/L (ref 3.5–5.1)
Sodium: 139 mmol/L (ref 135–145)
Total Bilirubin: 1.6 mg/dL — ABNORMAL HIGH (ref 0.0–1.2)
Total Protein: 7.8 g/dL (ref 6.5–8.1)

## 2024-05-31 LAB — URINALYSIS, ROUTINE W REFLEX MICROSCOPIC
Bilirubin Urine: NEGATIVE
Glucose, UA: NEGATIVE mg/dL
Hgb urine dipstick: NEGATIVE
Ketones, ur: NEGATIVE mg/dL
Nitrite: POSITIVE — AB
Protein, ur: NEGATIVE mg/dL
Specific Gravity, Urine: 1.014 (ref 1.005–1.030)
pH: 6 (ref 5.0–8.0)

## 2024-05-31 LAB — LIPASE, BLOOD: Lipase: 32 U/L (ref 11–51)

## 2024-05-31 MED ORDER — ONDANSETRON HCL 4 MG/2ML IJ SOLN
4.0000 mg | Freq: Once | INTRAMUSCULAR | Status: AC
  Administered 2024-05-31: 4 mg via INTRAVENOUS
  Filled 2024-05-31: qty 2

## 2024-05-31 MED ORDER — CEFUROXIME AXETIL 250 MG PO TABS: 250.0000 mg | ORAL_TABLET | Freq: Two times a day (BID) | ORAL | 0 refills | Status: AC

## 2024-05-31 MED ORDER — IOHEXOL 300 MG/ML  SOLN
100.0000 mL | Freq: Once | INTRAMUSCULAR | Status: AC | PRN
  Administered 2024-05-31: 100 mL via INTRAVENOUS

## 2024-05-31 MED ORDER — SODIUM CHLORIDE 0.9 % IV BOLUS
500.0000 mL | Freq: Once | INTRAVENOUS | Status: AC
  Administered 2024-05-31: 500 mL via INTRAVENOUS

## 2024-05-31 MED ORDER — ONDANSETRON HCL 4 MG PO TABS: 4.0000 mg | ORAL_TABLET | ORAL | 0 refills | Status: DC | PRN

## 2024-05-31 MED ORDER — FAMOTIDINE IN NACL 20-0.9 MG/50ML-% IV SOLN
20.0000 mg | Freq: Once | INTRAVENOUS | Status: AC
  Administered 2024-05-31: 20 mg via INTRAVENOUS
  Filled 2024-05-31: qty 50

## 2024-05-31 MED ORDER — SODIUM CHLORIDE 0.9 % IV SOLN
1.0000 g | Freq: Once | INTRAVENOUS | Status: AC
  Administered 2024-05-31: 1 g via INTRAVENOUS
  Filled 2024-05-31: qty 10

## 2024-05-31 MED ORDER — PANTOPRAZOLE SODIUM 20 MG PO TBEC: 20.0000 mg | DELAYED_RELEASE_TABLET | Freq: Every day | ORAL | 0 refills | Status: DC

## 2024-05-31 MED ORDER — SUCRALFATE 1 G PO TABS: 1.0000 g | ORAL_TABLET | Freq: Three times a day (TID) | ORAL | 0 refills | Status: AC

## 2024-05-31 NOTE — ED Triage Notes (Signed)
 Pt with abd pain and distended x 3 weeks, LBM 4 days ago.  + N/V

## 2024-05-31 NOTE — Discharge Instructions (Addendum)
 It was a pleasure caring for you today in the emergency department.  Your workup today is concerning for urinary tract infection.  Imaging also concerning for possible liver cancer.  Expect a phone call tomorrow from oncology office to schedule your follow-up appointment this week.  Recommend you abstain from any further alcohol use.  Be sure to follow a bland diet over the next 2 weeks.  Avoid fried, spicy or fatty foods.  You can eat things like bananas, rice, applesauce, toast, brothy soups that are easier on your stomach.  Please return to the emergency department for any worsening or worrisome symptoms.     RESOURCE GUIDE  Chronic Pain Problems: Contact [facility]  [phone] Patients need to be referred by their primary care doctor.  Insufficient Money for Medicine: Contact [employer]:  call [phone]  No Primary Care Doctor: Call [facility]  [phone] - can help you locate a primary care doctor that  accepts your insurance, provides certain services, etc. Physician Referral Service- [phone]  Agencies that provide inexpensive medical care: [facility]  [phone] [facility]  [phone] [facility]  [phone] [facility]  [phone] [facility]  [phone] [facility]  [phone]  Medicaid-accepting [facility] Providers: [facility]- [address], [address]  [phone], Mon-Fri 9am-7pm, Sat 9am-1pm [facility]- [address] [place], [address] [place]  [phone] [facility]- [address] [place]  [phone] [facility]- [address]  [phone] [facility] Lee[facility]- [address] [place], [address], [phone]  Only accepts [place] patients after they have their name  applied to their card  Self Pay (no insurance) in [facility]: Sickle Cell Patients - [facility]  [address]  [place], [phone] [facility]- [address] [place]  [phone]       [place] [facility] [place]- [address]       -     [facility]- see information above (Speak to [employer] if you do not have insurance)       -  [facility]- [address],  [phone]       -  [facility]- [address] [phone]       -  [facility]-  [address], [place], [phone]       -  [facility] - [address] [phone]       -  [facility]- [address], [phone], also [address], [phone]       -     [facility]- [address] [place], [phone], 1st & 3rd Saturday         every month, 10am-1pm  -     [facility]   [address] [place], [place].   Phone:  [phone], Fax:  [phone]. Hours of Operation:  9 am - 6 pm, M-F.  -     [facility]   [address], [place]   Phone: [phone], Fax: [phone]. Hours of Operation:  8:30 am - 5:30 pm, M-F.    Dental Assistance If unable to pay or uninsured, contact:  [facility]. to become qualified for the  adult dental clinic.  Patients with Medicaid: [facility] [phone], [phone] [address] [phone]  If unable to pay, or uninsured, contact [facility] [phone] in [place], [phone] in [facility]) to become qualified for the adult dental clinic  [facility] [address] [place], [place] [address] [phone] [email or web address]  Other Proofreader Services: [facility]- [address] [place], [place], [place], [address], [phone], Ext. [phone], 2nd and 4th Thursday of the month at 6:30am.  10 clients each day by appointment, can sometimes see walk-in patients if someone does not show for an appointment. [facility]- [address] [place], [place], [address], [phone] [facility] [address] [place], [place], [address], [phone] [facility]- [phone] [facility] Department- [phone] [facility] [phone]
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
## 2024-06-02 LAB — URINE CULTURE: Culture: >=100000 — AB

## 2024-06-03 ENCOUNTER — Telehealth (HOSPITAL_BASED_OUTPATIENT_CLINIC_OR_DEPARTMENT_OTHER): Payer: Self-pay

## 2024-06-03 NOTE — Telephone Encounter (Signed)
 Post ED Visit - Positive Culture Follow-up  Culture report reviewed by antimicrobial stewardship pharmacist: [facility] [x]  Rankin Sams, Pharm.D. []  Venetia Gully, Pharm.D., BCPS AQ-ID []  Garrel Crews, Pharm.D., BCPS []  Almarie Lunger, Pharm.D., BCPS []  [place], [address].D., BCPS, AAHIVP []  Rosaline Bihari, Pharm.D., BCPS, AAHIVP []  Vernell Meier, PharmD, BCPS []  Latanya Hint, PharmD, BCPS []  Donald Medley, PharmD, BCPS []  Rocky Bold, PharmD []  Dorothyann Alert, PharmD, BCPS []  Morene Babe, PharmD  [facility] []  Rosaline Edison, PharmD []  Romona Bliss, PharmD []  Dolphus Roller, PharmD []  Veva Seip, Rph []  Vernell Daunt) Leonce, PharmD []  Eva Allis, PharmD []  Rosaline Millet, PharmD []  Iantha Batch, PharmD []  Arvin Gauss, PharmD []  Jasmane Hasting, PharmD []  Ronal Rav, PharmD []  Rocky Slade, PharmD []  Bard Jeans, PharmD   Positive urine culture Treated with Cefuroxime, organism sensitive to the same and no further patient follow-up is required at this time.  Ruth Camelia Elbe 06/03/2024, 12:56 PM

## 2024-06-04 ENCOUNTER — Inpatient Hospital Stay

## 2024-06-04 ENCOUNTER — Inpatient Hospital Stay: Attending: Oncology | Admitting: Oncology

## 2024-06-04 VITALS — BP 118/83 | HR 62 | Temp 97.9°F | Resp 18 | Wt 136.1 lb

## 2024-06-04 DIAGNOSIS — R160 Hepatomegaly, not elsewhere classified: Secondary | ICD-10-CM

## 2024-06-04 LAB — CBC WITH DIFFERENTIAL/PLATELET
Abs Immature Granulocytes: 0.01 K/uL (ref 0.00–0.07)
Basophils Absolute: 0 K/uL (ref 0.0–0.1)
Basophils Relative: 0 %
Eosinophils Absolute: 0.2 K/uL (ref 0.0–0.5)
Eosinophils Relative: 3 %
HCT: 35.9 % — ABNORMAL LOW (ref 36.0–46.0)
Hemoglobin: 12.1 g/dL (ref 12.0–15.0)
Immature Granulocytes: 0 %
Lymphocytes Relative: 37 %
Lymphs Abs: 1.8 K/uL (ref 0.7–4.0)
MCH: 32.7 pg (ref 26.0–34.0)
MCHC: 33.7 g/dL (ref 30.0–36.0)
MCV: 97 fL (ref 80.0–100.0)
Monocytes Absolute: 0.3 K/uL (ref 0.1–1.0)
Monocytes Relative: 7 %
Neutro Abs: 2.5 K/uL (ref 1.7–7.7)
Neutrophils Relative %: 53 %
Platelets: 94 K/uL — ABNORMAL LOW (ref 150–400)
RBC: 3.7 MIL/uL — ABNORMAL LOW (ref 3.87–5.11)
RDW: 14.2 % (ref 11.5–15.5)
WBC: 4.7 K/uL (ref 4.0–10.5)
nRBC: 0 % (ref 0.0–0.2)

## 2024-06-04 LAB — COMPREHENSIVE METABOLIC PANEL WITH GFR
ALT: 63 U/L — ABNORMAL HIGH (ref 0–44)
AST: 87 U/L — ABNORMAL HIGH (ref 15–41)
Albumin: 3.4 g/dL — ABNORMAL LOW (ref 3.5–5.0)
Alkaline Phosphatase: 122 U/L (ref 38–126)
Anion gap: 11 (ref 5–15)
BUN: 11 mg/dL (ref 8–23)
CO2: 23 mmol/L (ref 22–32)
Calcium: 9.2 mg/dL (ref 8.9–10.3)
Chloride: 108 mmol/L (ref 98–111)
Creatinine, Ser: 0.65 mg/dL (ref 0.44–1.00)
GFR, Estimated: >60 mL/min
Glucose, Bld: 86 mg/dL (ref 70–99)
Potassium: 3.8 mmol/L (ref 3.5–5.1)
Sodium: 141 mmol/L (ref 135–145)
Total Bilirubin: 1.1 mg/dL (ref 0.0–1.2)
Total Protein: 7 g/dL (ref 6.5–8.1)
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
## 2024-06-04 NOTE — Patient Instructions (Signed)
 Rapid Diagnostic Service Visit Discharge Information and Instructions  Thank you for choosing  Cancer Care for your healthcare needs.  Below is a summary of today's discussion, along with our contact information and an outline of what to expect next.  Reason for Visit:  Liver mass  Proposed Diagnostic Care Plan: MRI Liver  Labs today.  CT chest to complete work-up   What to Expect: - Generally, when lab tests are ordered the results can take up to 1 week for results to be available.  At that point, we will contact you to discuss your results with you.  Unless there is a critical result, we will typically wait for all of your lab results to be available before contacting you. - If a biopsy is part of your Care Plan, those results can take on average 7-10 days to result.  Once results are available, we will contact you to discuss your pathology results and any next steps. - If you have additional imaging ordered, such as a CT Scan, MRI, Ultrasound, Bone Scan, or PET scan, your imaging will need to be authorized then scheduled with the earliest available appointment.  You may be asked to travel to another hospital within [facility] who has a sooner availability, please consider doing so if asked. - If you use MyChart, your results will be available to you in the MyChart portal.  Your provider will be in touch with you as soon as all of your results are available to be discussed.  Your Diagnostic Clinic Provider:  Randall Hope Your Diagnostic Navigator:  Dena Daring  If you or your caregiver have number blocking on your cell phones, please ensure the cancer center's numbers are not blocked.  If you are not a registered MyChart user, please consider enrolling in MyChart to receive your test results and visit notes.  You can also access your discharge instructions electronically.  MyChart also gives you an electronic means to communicate with your Care Team instead of needing to call in to the  cancer center.  We appreciate you trusting us  with your healthcare and look forward to partnering with you as we work to uncover what your potential diagnosis may be.  Please do not hesitate to reach out at any point with questions or concerns.

## 2024-06-04 NOTE — Assessment & Plan Note (Addendum)
#  Liver mass: --Differentials include HCC, intrahepatic cholangiocarcinoma versus metastatic lesion.  --Labs today to check CBC, CMP, CEA, CA19-9, AFP and chromogranin levels --Due to underlying cirrhosis, there is increased risk for HCC. Recommend MRI liver first before consideration of liver biopsy.  --Also recommended CT chest given she only had a CT abdomen and there were some pulmonary nodules. --RTC once workup is complete.

## 2024-06-05 LAB — CANCER ANTIGEN 19-9: CA 19-9: 139 U/mL — ABNORMAL HIGH (ref 0–35)

## 2024-06-05 LAB — CEA: CEA: 8.6 ng/mL — ABNORMAL HIGH (ref 0.0–4.7)

## 2024-06-05 LAB — AFP TUMOR MARKER: AFP, Serum, Tumor Marker: 11047 ng/mL — ABNORMAL HIGH (ref 0.0–9.2)

## 2024-06-06 LAB — CHROMOGRANIN A: Chromogranin A (ng/mL): 165.1 ng/mL — ABNORMAL HIGH (ref 0.0–101.8)

## 2024-06-08 ENCOUNTER — Ambulatory Visit: Payer: Self-pay | Admitting: Oncology

## 2024-06-08 NOTE — Progress Notes (Signed)
 This is a rapid diagnostic patient that you see at the end of the month.  Just FYI.  She is getting additional imaging done prior to seeing you.  Delon Hope, AGNP-C Department of Hematology/Oncology [facility] at [facility]  Phone: [phone]  06/08/2024 3:14 PM

## 2024-06-18 ENCOUNTER — Ambulatory Visit (HOSPITAL_COMMUNITY)
Admission: RE | Admit: 2024-06-18 | Discharge: 2024-06-18 | Disposition: A | Discharge status: Home / Self Care | Source: Ambulatory Visit | Attending: Oncology | Admitting: Oncology

## 2024-06-18 DIAGNOSIS — R160 Hepatomegaly, not elsewhere classified: Secondary | ICD-10-CM | POA: Diagnosis present

## 2024-06-18 MED ORDER — IOHEXOL 300 MG/ML  SOLN
75.0000 mL | Freq: Once | INTRAMUSCULAR | Status: AC | PRN
  Administered 2024-06-18: 75 mL via INTRAVENOUS

## 2024-06-21 ENCOUNTER — Ambulatory Visit (HOSPITAL_COMMUNITY)
Admission: RE | Admit: 2024-06-21 | Discharge: 2024-06-21 | Disposition: A | Discharge status: Home / Self Care | Source: Ambulatory Visit | Attending: Oncology | Admitting: Oncology

## 2024-06-21 DIAGNOSIS — R160 Hepatomegaly, not elsewhere classified: Secondary | ICD-10-CM | POA: Diagnosis present

## 2024-06-21 MED ORDER — GADOBUTROL 1 MMOL/ML IV SOLN
6.0000 mL | Freq: Once | INTRAVENOUS | Status: AC | PRN
  Administered 2024-06-21: 6 mL via INTRAVENOUS

## 2024-06-23 NOTE — Progress Notes (Signed)
 FYI. You see on 06/28/24.   Delon Hope, AGNP-C Department of Hematology/Oncology [facility] at [facility]  Phone: [phone]  06/23/2024 3:35 PM

## 2024-06-28 ENCOUNTER — Inpatient Hospital Stay: Admitting: Oncology
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
## 2024-06-30 ENCOUNTER — Inpatient Hospital Stay: Admitting: Oncology

## 2024-06-30 ENCOUNTER — Inpatient Hospital Stay

## 2024-06-30 ENCOUNTER — Other Ambulatory Visit: Payer: Self-pay

## 2024-06-30 VITALS — BP 144/81 | HR 73 | Temp 99.6°F | Resp 18 | Ht 63.0 in | Wt 137.0 lb

## 2024-06-30 DIAGNOSIS — F1091 Alcohol use, unspecified, in remission: Secondary | ICD-10-CM | POA: Diagnosis not present

## 2024-06-30 DIAGNOSIS — C220 Liver cell carcinoma: Secondary | ICD-10-CM

## 2024-06-30 DIAGNOSIS — K219 Gastro-esophageal reflux disease without esophagitis: Secondary | ICD-10-CM

## 2024-06-30 LAB — COMPREHENSIVE METABOLIC PANEL WITH GFR
ALT: 61 U/L — ABNORMAL HIGH (ref 0–44)
AST: 124 U/L — ABNORMAL HIGH (ref 15–41)
Albumin: 3.7 g/dL (ref 3.5–5.0)
Alkaline Phosphatase: 127 U/L — ABNORMAL HIGH (ref 38–126)
Anion gap: 10 (ref 5–15)
BUN: 9 mg/dL (ref 8–23)
CO2: 25 mmol/L (ref 22–32)
Calcium: 9.8 mg/dL (ref 8.9–10.3)
Chloride: 105 mmol/L (ref 98–111)
Creatinine, Ser: 0.56 mg/dL (ref 0.44–1.00)
GFR, Estimated: >60 mL/min
Glucose, Bld: 91 mg/dL (ref 70–99)
Potassium: 4.2 mmol/L (ref 3.5–5.1)
Sodium: 140 mmol/L (ref 135–145)
Total Bilirubin: 1.3 mg/dL — ABNORMAL HIGH (ref 0.0–1.2)
Total Protein: 7.6 g/dL (ref 6.5–8.1)

## 2024-06-30 LAB — PROTIME-INR
INR: 1.1 (ref 0.8–1.2)
Prothrombin Time: 14.9 s (ref 11.4–15.2)

## 2024-06-30 LAB — CBC WITH DIFFERENTIAL/PLATELET
Abs Immature Granulocytes: 0.02 10*3/uL (ref 0.00–0.07)
Basophils Absolute: 0 10*3/uL (ref 0.0–0.1)
Basophils Relative: 1 %
Eosinophils Absolute: 0.1 10*3/uL (ref 0.0–0.5)
Eosinophils Relative: 2 %
HCT: 38.3 % (ref 36.0–46.0)
Hemoglobin: 13.2 g/dL (ref 12.0–15.0)
Immature Granulocytes: 0 %
Lymphocytes Relative: 36 %
Lymphs Abs: 2.8 10*3/uL (ref 0.7–4.0)
MCH: 32.9 pg (ref 26.0–34.0)
MCHC: 34.5 g/dL (ref 30.0–36.0)
MCV: 95.5 fL (ref 80.0–100.0)
Monocytes Absolute: 0.5 10*3/uL (ref 0.1–1.0)
Monocytes Relative: 6 %
Neutro Abs: 4.3 10*3/uL (ref 1.7–7.7)
Neutrophils Relative %: 55 %
Platelets: 142 10*3/uL — ABNORMAL LOW (ref 150–400)
RBC: 4.01 MIL/uL (ref 3.87–5.11)
RDW: 14.1 % (ref 11.5–15.5)
WBC: 7.7 10*3/uL (ref 4.0–10.5)
nRBC: 0 % (ref 0.0–0.2)

## 2024-06-30 MED ORDER — ONDANSETRON HCL 4 MG PO TABS: 4.0000 mg | ORAL_TABLET | ORAL | 0 refills | Status: AC | PRN

## 2024-06-30 MED ORDER — PANTOPRAZOLE SODIUM 20 MG PO TBEC: 20.0000 mg | DELAYED_RELEASE_TABLET | Freq: Every day | ORAL | 0 refills | Status: AC

## 2024-06-30 NOTE — Patient Instructions (Addendum)
 [facility]  Discharge Instructions  You were seen and examined today by Dr. Davonna. Dr. Davonna is a medical oncologist, meaning that she specializes in the treatment of cancer diagnoses. Dr. Davonna discussed your past medical history, family history of cancers, and the events that led to you being here today.  You were referred to Dr. Davonna due to a new diagnosis of hepatocellular (liver) cancer.  Dr. Davonna has reviewed your recent CT scans and MRI and has recommended treatment with two different medications. They are called Avastin and Tecentriq. We will need have you cleared by a GI doctor by way of using a light to look down your esophagus to make sure you don't have any pouches that may lead to bleeding. We will make this referral today. The Avastin puts you at risk for bleeding. The Tecentriq is an immunotherapy drug that works with your body's own immune system to fight the cancer.   We will plan to start treatment in about two weeks.   Follow-up as scheduled.  Thank you for choosing  [facility] to provide your oncology and hematology care.   To afford each patient quality time with our provider, please arrive at least 15 minutes before your scheduled appointment time. You may need to reschedule your appointment if you arrive late (10 or more minutes). Arriving late affects you and other patients whose appointments are after yours.  Also, if you miss three or more appointments without notifying the office, you may be dismissed from the clinic at the providers discretion.    Again, thank you for choosing [facility].  Our hope is that these requests will decrease the amount of time that you wait before being seen by our physicians.   If you have a lab appointment with the Cancer Center - please note that after April 8th, all labs will be drawn in the cancer center.  You do not have to check in or register with the  main entrance as you have in the past but will complete your check-in at the cancer center.            _____________________________________________________________  Should you have questions after your visit to [facility], please contact our office at [phone] and follow the prompts.  Our office hours are 8:00 a.m. to 4:30 p.m. Monday - Thursday and 8:00 a.m. to 2:30 p.m. Friday.  Please note that voicemails left after 4:00 p.m. may not be returned until the following business day.  We are closed weekends and all major holidays.  You do have access to a nurse 24-7, just call the main number to the clinic [phone] and do not press any options, hold on the line and a nurse will answer the phone.    For prescription refill requests, have your pharmacy contact our office and allow 72 hours.    Masks are no longer required in the cancer centers. If you would like for your care team to wear a mask while they are taking care of you, please let them know. You may have one support person who is at least 63 years old accompany you for your appointments.

## 2024-07-02 DIAGNOSIS — C220 Liver cell carcinoma: Secondary | ICD-10-CM

## 2024-07-02 NOTE — Patient Instructions (Incomplete)
 [facility] Chemotherapy Teaching   You will see the doctor regularly throughout treatment.  We will obtain blood work from you prior to every treatment and monitor your results to make sure it is safe to give your treatment. The doctor monitors your response to treatment by the way you are feeling, your blood work, and by obtaining scans periodically.  There will be wait times while you are here for treatment.  It will take about 30 minutes to 1 hour for your lab work to result.  Then there will be wait times while pharmacy mixes your medications.    Bevacizumab (Avastin)  About This Drug Bevacizumab is used to treat cancer. It is given in the vein (IV). It will take 30 minutes to infuse.   Possible Side Effects  Teary eyes   Runny/stuffy nose   Nosebleed   Changes in the way food and drinks taste   Headache   Back pain   Protein in your urine   Bleeding in your rectum   Dry skin   A red skin rash which can be peeling or scaling   High blood pressure  Note: Each of the side effects above was reported in 10% or greater of patients treated with bevacizumab-xxxx. Not all possible side effects are included above.  Warnings and Precautions   Perforation or fistula- an abnormal hole in your stomach, intestine, esophagus, or other organ, which can be life-threatening  Slow wound healing, which can be life-threatening  Abnormal bleeding which can be life-threatening - symptoms may be coughing up blood, throwing up blood (may look like coffee grounds), red or black tarry bowel movements, abnormally heavy menstrual flow, nosebleeds or any other unusual bleeding.  Blood clots and events such as stroke and heart attack. A blood clot in your leg may cause your leg to swell, appear red and warm, and/or cause pain. A blood clot in your lungs may cause trouble breathing, pain when breathing, and/or chest pain.  Severe high blood pressure  Changes in your central nervous  system can happen. The central nervous system is made up of your brain and spinal cord. You could feel extreme tiredness, agitation, confusion, hallucinations   This drug may interact with other medicines. Tell your doctor and pharmacist about all the medicines and dietary supplements (vitamins, minerals, herbs and others) that you are taking at this time. Also, check with your doctor or pharmacist before starting any new prescription or over-the-counter medicines, or dietary supplements to make sure that there are no interactions.  When to Call the Doctor Call your doctor or nurse if you have any of these symptoms and/or any new or unusual symptoms:   Fever of 100.4 F (38 C) or higher   Chills   Confusion and/or agitation   Hallucinations   Trouble understanding or speaking   Headache that does not go away   Nose bleed that doesnt stop bleeding after 10 -15 minutes   Feeling dizzy or lightheaded   Blurry vision or changes in your eyesight   Difficulty swallowing   Easy bleeding or bruising   Blood in your urine, vomit (bright red or coffee-ground) and/or stools ( bright red, or black/tarry)   Coughing up blood   Wheezing and/or trouble breathing   Chest pain or symptoms of a heart attack. Most heart attacks involve pain in the center of the chest that lasts more than a few minutes. The pain may go away and come back. It can feel like  pressure, squeezing, fullness, or pain. Sometimes pain is felt in one or both arms, the back, neck, jaw, or stomach. If any of these symptoms last 2 minutes, call 911.   Symptoms of a stroke such as sudden numbness or weakness of your face, arm, or leg, mostly on one side of your body; sudden confusion, trouble speaking or understanding; sudden trouble seeing in one or both eyes; sudden trouble walking, feeling dizzy, loss of balance or coordination; or sudden, bad headache with no known cause. If you have any of these symptoms for 2 minutes, call  911.   Numbness or lack of strength to your arms, legs, face, or body   Nausea that stops you from eating or drinking and/or relieved by prescribed medicine   Throwing up   Pain in your abdomen that does not go away   Foamy or bubbly-looking urine   Signs of infusion reaction: fever or shaking chills, flushing, facial swelling, feeling dizzy, headache, trouble breathing, rash, itching, chest tightness, or chest pain.   Pain that does not go away or is not relieved by prescribed medicine   Your leg or arm is swollen, red, warm and/or painful   Swelling of arms, hands, legs and/or feet   Weight gain of 5 pounds in one week (fluid retention)   If you think you may be pregnant  Reproduction Warnings  Pregnancy warning: This drug can have harmful effects on the unborn baby. Women of child bearing potential should use effective methods of birth control during your cancer treatment and for 6 months after treatment. In women, changes in your ovaries may happen that may cause menstrual bleeding to become irregular or stop, do not assume you cannot get pregnant. Let your doctor know right away if you think you may be pregnant   Breastfeeding warning: Women should not breastfeed during treatment and for 6 months after treatment because this drug could enter the breast milk and cause harm to a breastfeeding baby.    Fertility warning: In women, this drug may affect your ability to have children in the future. Talk with your doctor or nurse if you plan to have children. Ask for information on egg banking.   Atezolizumab Melene)  About This Drug Atezolizumab is used to treat cancer. It is given by the vein (IV). The first infusion takes one hour. All subsequent infusions will be given over 30 minutes.   Possible Side Effects  Nausea  Tiredness and weakness  Decreased appetite (decreased hunger)  Cough  Trouble breathing  Note: Each of the side effects above was reported in 20% or  greater of patients treated with atezolizumab. Your side effects may be different if you are taking atezolizumab in combination with another agent. Not all possible side effects are included above.  Warnings and Precautions  This drug works with your immune system and can cause inflammation (swelling) in any of your organs and tissues and can change how they work. This may put you at risk for developing serious medical problems, which can be life-threatening.   Inflammation of the lungs which can be life-threatening. You may have a dry cough or trouble breathing.   Severe changes in your liver function which can cause liver failure and be life-threatening.   Colitis which is swelling in the colon. The symptoms are diarrhea, stomach cramping, and sometimes blood in the bowel movements.   Changes in your central nervous system can happen. The central nervous system is made up of your brain and  spinal cord. You could feel extreme tiredness, agitation, confusion, hallucinations (see or hear things that are not there), trouble understanding or speaking, loss of control of your bowels or bladder, eyesight changes, numbness or lack of strength to your arms, legs, face, or body, and coma. If you start to have any of these symptoms let your doctor know right away.   This drug may affect your hormone glands (thyroid, adrenals, pituitary and pancreas).   Blood sugar levels may change, and you may develop diabetes. If you already have diabetes, changes may need to be made to your diabetes medication.   Severe infections, including viral, bacterial and fungal, which can be life-threatening   While you are getting this drug in your vein (IV), you may have a reaction to the drug. Sometimes you may be given medication to stop or lessen these side effects. Your nurse will check you closely for these signs: fever or shaking chills, flushing, facial swelling, feeling dizzy, headache, trouble breathing, rash,  itching, chest tightness, or chest pain. These reactions may happen after your infusion. If this happens, call 911 for emergency care.  Note: Some of the side effects above are very rare. If you have concerns and/or questions, please discuss them with your medical team.  Important Information  This drug may be present in the saliva, tears, sweat, urine, stool, vomit, semen, and vaginal secretions. Talk to your doctor and/or your nurse about the necessary precautions to take during this time.   Treating Side Effects   Manage tiredness by pacing your activities for the day.   Be sure to include periods of rest between energy-draining activities.   To decrease the risk of infection, wash your hands regularly.   Avoid close contact with people who have a cold, the flu, or other infections.   Take your temperature as your doctor or nurse tells you, and whenever you feel like you may have a fever.   Drink plenty of fluids (a minimum of eight glasses per day is recommended).   Ask your doctor or nurse about medicine that is available to help stop or lessen loose bowel movements.   To help with nausea, eat small, frequent meals instead of three large meals a day. Choose foods and drinks that are at room temperature. Ask your nurse or doctor about other helpful tips and medicine that is available to help stop or lessen these symptoms.  To help with decreased appetite, eat small, frequent meals. Eat foods high in calories and protein, such as meat, poultry, fish, dry beans, tofu, eggs, nuts, milk, yogurt, cheese, ice cream, pudding, and nutritional supplements.   Consider using sauces and spices to increase taste. Daily exercise, with your doctors approval, may increase your appetite.   If you have diabetes, keep good control of your blood sugar level. Tell your nurse or your doctor if your glucose levels are higher or lower than normal.   Keeping your pain under control is important to your  well-being. Please tell your doctor or nurse if you are experiencing pain.   If you get a rash do not put anything on it unless your doctor or nurse says you may. Keep the area around the rash clean and dry. Ask your doctor for medicine if your rash bothers you.   If you have numbness and tingling in your hands and feet, be careful when cooking, walking, and handling sharp objects and hot liquids.   Infusion reactions may happen after your infusion. If this happens,  call 911 for emergency care.  Food and Drug Interactions  There are no known interactions of atezolizumab with food.   This drug may interact with other medicines. Tell your doctor and pharmacist about all the prescription and over-the-counter medicines and dietary supplements (vitamins, minerals, herbs and others) that you are taking at this time. Also, check with your doctor or pharmacist before starting any new prescription or over-the-counter medicines, or dietary supplements to make sure that there are no interactions.  When to Call the Doctor  Call your doctor or nurse if you have any of these symptoms and/or any new or unusual symptoms:   Fever of 100.4 F (38 C) or higher   Chills   Tiredness that interferes with your daily activities   Feeling dizzy or lightheaded   Pain in your chest   Dry cough   Coughing up yellow, green, or bloody mucus   Wheezing or trouble breathing   Feeling that your heart is beating in a fast or not normal way (palpitations)   Confusion and/or agitation   Hallucinations   Trouble understanding or speaking   Numbness or lack of strength to your arms, legs, face, or body   Blurred vision or other changes in eyesight   Diarrhea, 4 times in one day or diarrhea with lack of strength or a feeling of being dizzy   Pain in your abdomen that does not go away   Blood in your stool   Nausea that stops you from eating or drinking and/or is not relieved by prescribed medicines    Throwing up   Lasting loss of appetite or rapid weight loss of five pounds in a week   Abnormal blood sugar   Unusual thirst, passing urine often, headache, sweating, shakiness, irritability   Pain that does not go away, or is not relieved by prescribed medicines   Numbness, tingling, or pain your hands and feet   Extreme weakness that interferes with normal activities   A new rash or a rash that is not relieved by prescribed medicines   Signs of infusion reaction: fever or shaking chills, flushing, facial swelling, feeling dizzy, headache, trouble breathing, rash, itching, chest tightness, or chest pain. If this happens, call 911 for emergency care.   Signs of possible liver problems: dark urine, pale bowel movements, bad stomach pain, feeling very tired and weak, unusual itching, or yellowing of the eyes or skin   If you think you may be pregnant  Reproduction Warnings   Pregnancy warning: This drug can have harmful effects on the unborn baby. Women of childbearing potential should use effective methods of birth control during your cancer treatment and for at least 5 months after treatment. Let your doctor know right away if you think you may be pregnant.   Breastfeeding warning: It is not known if this drug passes into breast milk. For this reason, Women should not breastfeed during treatment and for at least 5 months after treatment because this drug could enter the breast milk and cause harm to a breastfeeding baby.   Fertility warning: In women, this drug may affect your ability to have children in the future. Talk with your doctor or nurse if you plan to have children. Ask for information on egg banking.   SELF CARE ACTIVITIES WHILE ON IMMUNOTHERAPY:  Hydration Increase your fluid intake and drink at least 8 to 12 cups (64 ounces) of water /decaffeinated beverages per day after treatment. You can still have your cup of coffee or  soda but these beverages do not count as part of  your 8 to 12 cups that you need to drink daily. No alcohol intake.  Medications Continue taking your normal prescription medication as prescribed.  If you start any new herbal or new supplements please let us  know first to make sure it is safe.  Skin Care Always use sunscreen that has not expired and with SPF (Sun Protection Factor) of 50 or higher. Wear hats to protect your head from the sun. Remember to use sunscreen on your hands, ears, face, & feet.  Use good moisturizing lotions such as udder cream, eucerin, or even Vaseline. Some chemotherapies can cause dry skin, color changes in your skin and nails.    Avoid long, hot showers or baths. Use gentle, fragrance-free soaps and laundry detergent. Use moisturizers, preferably creams or ointments rather than lotions because the thicker consistency is better at preventing skin dehydration. Apply the cream or ointment within 15 minutes of showering. Reapply moisturizer at night, and moisturize your hands every time after you wash them.  Infection Prevention Please wash your hands for at least 30 seconds using warm soapy water . Handwashing is the #1 way to prevent the spread of germs. Stay away from sick people or people who are getting over a cold. If you develop respiratory systems such as green/yellow mucus production or productive cough or persistent cough let us  know and we will see if you need an antibiotic. It is a good idea to keep a pair of gloves on when going into grocery stores/[employer] to decrease your risk of coming into contact with germs on the carts, etc. Carry alcohol hand gel with you at all times and use it frequently if out in public. If your temperature reaches 100.5 or higher please call the clinic and let us  know.  If it is after hours or on the weekend please go to the ER if your temperature is over 100.4.  Please have your own personal thermometer at home to use.    Sex and bodily fluids If you are going to have sex, a condom  must be used to protect the person that isn't taking immunotherapy. For a few days after treatment, immunotherapy can be excreted through your bodily fluids.  When using the toilet please close the lid and flush the toilet twice.  Do this for a few day after you have had immunotherapy.   Contraception It is not known for sure whether or not immunotherapy drugs can be passed on through semen or secretions from the vagina. Because of this some doctors advise people to use a barrier method if you have sex during treatment. This applies to vaginal, anal or oral sex.  Generally, doctors advise a barrier method only for the time you are actually having the treatment and for about a week after your treatment.  Advice like this can be worrying, but this does not mean that you have to avoid being intimate with your partner. You can still have close contact with your partner and continue to enjoy sex.   Foods to avoid Some foods have a higher risk of becoming tainted with bacteria. These include: Unwashed fresh fruit and vegetables, especially leafy vegetables that can hide dirt and other contaminants Raw sprouts, such as alfalfa sprouts Raw or undercooked beef, especially ground beef, or other raw or undercooked meat and poultry Fatty, fried, or spicy foods immediately before or after treatment.  These can sit heavy on your stomach and make you feel  nauseous. Raw or undercooked shellfish, such as oysters. Sushi and sashimi, which often contain raw fish.  Unpasteurized beverages, such as unpasteurized fruit juices, raw milk, raw yogurt, or cider Undercooked eggs, such as soft boiled, over easy, and poached; raw, unpasteurized eggs; or foods made with raw egg, such as homemade raw cookie dough and homemade mayonnaise  Simple steps for food safety  Shop smart. Do not buy food stored or displayed in an unclean area. Do not buy bruised or damaged fruits or vegetables. Do not buy cans that have cracks,  dents, or bulges. Pick up foods that can spoil at the end of your shopping trip and store them in a cooler on the way home.  Prepare and clean up foods carefully. Rinse all fresh fruits and vegetables under running water , and dry them with a clean towel or paper towel. Clean the top of cans before opening them. After preparing food, wash your hands for 20 seconds with hot water  and soap. Pay special attention to areas between fingers and under nails. Clean your utensils and dishes with hot water  and soap. Disinfect your kitchen and cutting boards using 1 teaspoon of liquid, unscented bleach mixed into 1 quart of water .    Dispose of old food. Eat canned and packaged food before its expiration date (the use by or best before date). Consume refrigerated leftovers within 3 to 4 days. After that time, throw out the food. Even if the food does not smell or look spoiled, it still may be unsafe. Some bacteria, such as Listeria, can grow even on foods stored in the refrigerator if they are kept for too long.  Take precautions when eating out. At restaurants, avoid buffets and salad bars where food sits out for a long time and comes in contact with many people. Food can become contaminated when someone with a virus, often a norovirus, or another bug handles it. Put any leftover food in a to-go container yourself, rather than having the server do it. And, refrigerate leftovers as soon as you get home. Choose restaurants that are clean and that are willing to prepare your food as you order it cooked.    SYMPTOMS TO REPORT AS SOON AS POSSIBLE AFTER TREATMENT:  FEVER GREATER THAN 100.4 F CHILLS WITH OR WITHOUT FEVER UNUSUAL SHORTNESS OF BREATH URINARY PROBLEMS BOWEL PROBLEMS (uncontrolled diarrhea) UNUSUAL RASH     Wear comfortable clothing and clothing appropriate for easy access to any Portacath or PICC line. Let us  know if there is anything that we can do to make your therapy  better!   What to do if you need assistance after hours or on the weekends: CALL [phone].  HOLD on the line, do not hang up.  You will hear multiple messages but at the end you will be connected with a nurse triage line.  They will contact the doctor if necessary.  Most of the time they will be able to assist you.  Do not call the hospital operator.    I have been informed and understand all of the instructions given to me and have received a copy. I have been instructed to call the clinic [phone] or my family physician as soon as possible for continued medical care, if indicated. I do not have any more questions at this time but understand that I may call the Cancer Center or the Patient Navigator at [phone] during office hours should I have questions or need assistance in obtaining follow-up care.

## 2024-07-05 ENCOUNTER — Inpatient Hospital Stay

## 2024-07-08 ENCOUNTER — Ambulatory Visit: Admitting: Gastroenterology

## 2024-07-08 ENCOUNTER — Other Ambulatory Visit: Payer: Self-pay | Admitting: Oncology

## 2024-07-08 ENCOUNTER — Encounter: Payer: Self-pay | Admitting: Oncology

## 2024-07-08 ENCOUNTER — Telehealth: Payer: Self-pay | Admitting: Gastroenterology

## 2024-07-08 DIAGNOSIS — C220 Liver cell carcinoma: Secondary | ICD-10-CM | POA: Insufficient documentation

## 2024-07-08 NOTE — Telephone Encounter (Signed)
 Patient no-showed appointment today. I suspect weather played a role in this.   She is scheduled for 2/12 with Dr. Cindie.  Ann: I'm sending this to you just in case so we can expedite her appointment if she is unable to see him that day due to scheduling.

## 2024-07-08 NOTE — Progress Notes (Signed)
 START ON PATHWAY REGIMEN - Hepatobiliary     Cycle 1: A cycle is 28 days:     Tremelimumab-actl      Durvalumab    Cycles 2 and beyond: A cycle is every 28 days:     Durvalumab   **Always confirm dose/schedule in your pharmacy ordering system**  Patient Characteristics: Hepatocellular Carcinoma, Unresectable or Nonsurgical Candidate, Locally Advanced or Metastatic Disease Not Amenable to Locoregional Therapy, Systemic Therapy, First Line, No Prior Transplant and Candidate for Immunotherapy Hepatobiliary Disease Type: Hepatocellular Carcinoma Line of Therapy: First Line Intent of Therapy: Non-Curative / Palliative Intent, Discussed with Patient

## 2024-07-09 NOTE — Progress Notes (Signed)
" °  Rapid Diagnostic Service for Malignancies [facility]  Diagnostic Nurse Navigator Treatment Team Hand-Off Note  07/09/24  Patient Name:  Allison Murray Patient MRN:  [id number] Patient DOB:  Oct 20, 1960   Patient Care Team: Patient, No Pcp Per as PCP - General (General Practice) Delores Dena RAMAN, RN as Oncology Nurse Navigator (Oncology) Celestia Joesph SQUIBB, RN as Oncology Nurse Navigator (Medical Oncology) Davonna Siad, MD as Medical Oncologist (Medical Oncology)  Chief Complaint Liver mass, pulmonary nodules, and R adrenal mass  Oncology History  Hepatocellular carcinoma (HCC)  07/08/2024 Initial Diagnosis   Hepatocellular carcinoma (HCC)   07/09/2024 -  Chemotherapy   Patient is on Treatment Plan : Hepatocellular Carcinoma Tremelimumab-actl C1 D1 + Durvalumab q28d        Cancer Staging  No matching staging information was found for the patient.   SDOH Screening and Interventions Updated:  Yes  SDOH Screenings   Food Insecurity: No Food Insecurity (12/27/2022)  Housing: Low Risk (12/27/2022)  Transportation Needs: No Transportation Needs (12/27/2022)  Utilities: Not At Risk (12/27/2022)  Depression (PHQ2-9): Low Risk (06/30/2024)  Tobacco Use: Low Risk (05/31/2024)     Genetics Assessment Completed:  No Genetics Referral Made:  no  Care Team Updated:  Yes  "

## 2024-07-09 NOTE — Telephone Encounter (Signed)
 Allison Murray at [facility] is aware patient NS for her apt

## 2024-07-12 ENCOUNTER — Inpatient Hospital Stay

## 2024-07-14 ENCOUNTER — Inpatient Hospital Stay: Admitting: Licensed Clinical Social Worker

## 2024-07-15 ENCOUNTER — Ambulatory Visit: Admitting: Internal Medicine

## 2024-07-15 ENCOUNTER — Inpatient Hospital Stay

## 2024-07-19 ENCOUNTER — Inpatient Hospital Stay: Admitting: Dietician

## 2024-07-26 ENCOUNTER — Ambulatory Visit: Admitting: Internal Medicine

## 2024-08-05 ENCOUNTER — Inpatient Hospital Stay

## 2024-08-05 ENCOUNTER — Inpatient Hospital Stay: Admitting: Oncology

## 2024-08-11 ENCOUNTER — Inpatient Hospital Stay

## 2024-08-11 ENCOUNTER — Inpatient Hospital Stay: Admitting: Oncology
